# Patient Record
Sex: Male | Born: 1945 | Race: White | Hispanic: No | Marital: Married | Smoking: Former smoker
Health system: Southern US, Community
[De-identification: ages and names within clinical notes are randomized; demographics above are authoritative.]

## PROBLEM LIST (undated history)

## (undated) DIAGNOSIS — G459 Transient cerebral ischemic attack, unspecified: Secondary | ICD-10-CM

## (undated) DIAGNOSIS — S83209A Unspecified tear of unspecified meniscus, current injury, unspecified knee, initial encounter: Secondary | ICD-10-CM

## (undated) DIAGNOSIS — B159 Hepatitis A without hepatic coma: Secondary | ICD-10-CM

## (undated) DIAGNOSIS — Z8042 Family history of malignant neoplasm of prostate: Secondary | ICD-10-CM

## (undated) DIAGNOSIS — R972 Elevated prostate specific antigen [PSA]: Secondary | ICD-10-CM

## (undated) DIAGNOSIS — T7840XA Allergy, unspecified, initial encounter: Secondary | ICD-10-CM

## (undated) DIAGNOSIS — H9191 Unspecified hearing loss, right ear: Secondary | ICD-10-CM

## (undated) HISTORY — DX: Unspecified tear of unspecified meniscus, current injury, unspecified knee, initial encounter: S83.209A

## (undated) HISTORY — DX: Hepatitis a without hepatic coma: B15.9

## (undated) HISTORY — DX: Allergy, unspecified, initial encounter: T78.40XA

## (undated) HISTORY — DX: Unspecified hearing loss, right ear: H91.91

## (undated) HISTORY — PX: COLONOSCOPY: SHX174

## (undated) HISTORY — DX: Transient cerebral ischemic attack, unspecified: G45.9

## (undated) HISTORY — DX: Family history of malignant neoplasm of prostate: Z80.42

## (undated) HISTORY — DX: Elevated prostate specific antigen (PSA): R97.20

---

## 1966-07-03 DIAGNOSIS — B159 Hepatitis A without hepatic coma: Secondary | ICD-10-CM

## 1966-07-03 HISTORY — DX: Hepatitis a without hepatic coma: B15.9

## 1997-07-03 HISTORY — PX: HEMORRHOID SURGERY: SHX153

## 1998-07-03 HISTORY — PX: ACOUSTIC NEUROMA RESECTION: SHX5713

## 2012-12-31 DIAGNOSIS — G459 Transient cerebral ischemic attack, unspecified: Secondary | ICD-10-CM

## 2012-12-31 HISTORY — DX: Transient cerebral ischemic attack, unspecified: G45.9

## 2013-01-06 DIAGNOSIS — R7989 Other specified abnormal findings of blood chemistry: Secondary | ICD-10-CM | POA: Diagnosis not present

## 2013-01-06 DIAGNOSIS — Z6827 Body mass index (BMI) 27.0-27.9, adult: Secondary | ICD-10-CM | POA: Diagnosis not present

## 2013-01-06 DIAGNOSIS — D333 Benign neoplasm of cranial nerves: Secondary | ICD-10-CM | POA: Diagnosis not present

## 2013-01-06 DIAGNOSIS — G459 Transient cerebral ischemic attack, unspecified: Secondary | ICD-10-CM | POA: Diagnosis not present

## 2013-01-06 DIAGNOSIS — R4789 Other speech disturbances: Secondary | ICD-10-CM | POA: Diagnosis not present

## 2013-01-06 DIAGNOSIS — I6350 Cerebral infarction due to unspecified occlusion or stenosis of unspecified cerebral artery: Secondary | ICD-10-CM | POA: Diagnosis not present

## 2013-01-06 DIAGNOSIS — R29810 Facial weakness: Secondary | ICD-10-CM | POA: Diagnosis not present

## 2013-01-06 DIAGNOSIS — Z87891 Personal history of nicotine dependence: Secondary | ICD-10-CM | POA: Diagnosis not present

## 2013-01-06 DIAGNOSIS — Z8589 Personal history of malignant neoplasm of other organs and systems: Secondary | ICD-10-CM | POA: Diagnosis not present

## 2013-01-06 DIAGNOSIS — R7309 Other abnormal glucose: Secondary | ICD-10-CM | POA: Diagnosis not present

## 2013-01-06 DIAGNOSIS — E663 Overweight: Secondary | ICD-10-CM | POA: Diagnosis not present

## 2013-01-07 DIAGNOSIS — R4789 Other speech disturbances: Secondary | ICD-10-CM | POA: Diagnosis not present

## 2013-01-07 DIAGNOSIS — D333 Benign neoplasm of cranial nerves: Secondary | ICD-10-CM | POA: Diagnosis not present

## 2013-01-07 DIAGNOSIS — R7309 Other abnormal glucose: Secondary | ICD-10-CM | POA: Diagnosis not present

## 2013-01-07 DIAGNOSIS — I079 Rheumatic tricuspid valve disease, unspecified: Secondary | ICD-10-CM | POA: Diagnosis not present

## 2013-01-07 DIAGNOSIS — G458 Other transient cerebral ischemic attacks and related syndromes: Secondary | ICD-10-CM | POA: Diagnosis not present

## 2013-01-07 DIAGNOSIS — Z8589 Personal history of malignant neoplasm of other organs and systems: Secondary | ICD-10-CM | POA: Diagnosis not present

## 2013-01-07 DIAGNOSIS — R7989 Other specified abnormal findings of blood chemistry: Secondary | ICD-10-CM | POA: Diagnosis not present

## 2013-01-07 DIAGNOSIS — G459 Transient cerebral ischemic attack, unspecified: Secondary | ICD-10-CM | POA: Diagnosis not present

## 2013-01-07 DIAGNOSIS — I6789 Other cerebrovascular disease: Secondary | ICD-10-CM | POA: Diagnosis not present

## 2013-01-07 DIAGNOSIS — I059 Rheumatic mitral valve disease, unspecified: Secondary | ICD-10-CM | POA: Diagnosis not present

## 2013-01-07 DIAGNOSIS — I6350 Cerebral infarction due to unspecified occlusion or stenosis of unspecified cerebral artery: Secondary | ICD-10-CM | POA: Diagnosis not present

## 2013-01-09 DIAGNOSIS — Z8669 Personal history of other diseases of the nervous system and sense organs: Secondary | ICD-10-CM | POA: Diagnosis not present

## 2013-01-09 DIAGNOSIS — G459 Transient cerebral ischemic attack, unspecified: Secondary | ICD-10-CM | POA: Diagnosis not present

## 2013-02-11 DIAGNOSIS — Z8673 Personal history of transient ischemic attack (TIA), and cerebral infarction without residual deficits: Secondary | ICD-10-CM | POA: Diagnosis not present

## 2013-02-11 DIAGNOSIS — D333 Benign neoplasm of cranial nerves: Secondary | ICD-10-CM | POA: Diagnosis not present

## 2013-03-29 DIAGNOSIS — Z23 Encounter for immunization: Secondary | ICD-10-CM | POA: Diagnosis not present

## 2013-05-16 DIAGNOSIS — Z0000 Encounter for general adult medical examination without abnormal findings: Secondary | ICD-10-CM | POA: Diagnosis not present

## 2013-05-16 DIAGNOSIS — Z8042 Family history of malignant neoplasm of prostate: Secondary | ICD-10-CM | POA: Diagnosis not present

## 2013-05-16 DIAGNOSIS — G459 Transient cerebral ischemic attack, unspecified: Secondary | ICD-10-CM | POA: Diagnosis not present

## 2013-05-16 DIAGNOSIS — Z13220 Encounter for screening for lipoid disorders: Secondary | ICD-10-CM | POA: Diagnosis not present

## 2013-05-20 DIAGNOSIS — Z0000 Encounter for general adult medical examination without abnormal findings: Secondary | ICD-10-CM | POA: Diagnosis not present

## 2014-01-21 DIAGNOSIS — M1710 Unilateral primary osteoarthritis, unspecified knee: Secondary | ICD-10-CM | POA: Diagnosis not present

## 2014-01-21 DIAGNOSIS — IMO0002 Reserved for concepts with insufficient information to code with codable children: Secondary | ICD-10-CM | POA: Diagnosis not present

## 2014-01-21 DIAGNOSIS — M25569 Pain in unspecified knee: Secondary | ICD-10-CM | POA: Diagnosis not present

## 2014-03-04 DIAGNOSIS — M2240 Chondromalacia patellae, unspecified knee: Secondary | ICD-10-CM | POA: Diagnosis not present

## 2014-06-17 DIAGNOSIS — L821 Other seborrheic keratosis: Secondary | ICD-10-CM | POA: Diagnosis not present

## 2014-06-17 DIAGNOSIS — D485 Neoplasm of uncertain behavior of skin: Secondary | ICD-10-CM | POA: Diagnosis not present

## 2014-06-17 DIAGNOSIS — D239 Other benign neoplasm of skin, unspecified: Secondary | ICD-10-CM | POA: Diagnosis not present

## 2014-06-17 DIAGNOSIS — D225 Melanocytic nevi of trunk: Secondary | ICD-10-CM | POA: Diagnosis not present

## 2014-06-17 DIAGNOSIS — L570 Actinic keratosis: Secondary | ICD-10-CM | POA: Diagnosis not present

## 2014-07-22 DIAGNOSIS — M2242 Chondromalacia patellae, left knee: Secondary | ICD-10-CM | POA: Diagnosis not present

## 2014-07-29 ENCOUNTER — Encounter: Payer: Self-pay | Admitting: Family Medicine

## 2014-07-29 ENCOUNTER — Ambulatory Visit (INDEPENDENT_AMBULATORY_CARE_PROVIDER_SITE_OTHER): Payer: Medicare Other | Admitting: Family Medicine

## 2014-07-29 VITALS — BP 118/78 | HR 58 | Temp 97.9°F | Ht 70.0 in | Wt 179.0 lb

## 2014-07-29 DIAGNOSIS — Z8042 Family history of malignant neoplasm of prostate: Secondary | ICD-10-CM | POA: Diagnosis not present

## 2014-07-29 DIAGNOSIS — J300 Vasomotor rhinitis: Secondary | ICD-10-CM

## 2014-07-29 DIAGNOSIS — Z8673 Personal history of transient ischemic attack (TIA), and cerebral infarction without residual deficits: Secondary | ICD-10-CM | POA: Diagnosis not present

## 2014-07-29 MED ORDER — AZELASTINE HCL 0.15 % NA SOLN: NASAL | Status: DC

## 2014-07-29 NOTE — Progress Notes (Signed)
Office Note 07/29/2014  CC:  Chief Complaint  Patient presents with  . Establish Care   HPI:   Richard Macdonald is a 69 y.o. White male who is here to establish care. Patient's most recent primary MD: Dr. Cheryle Horsfall in [place], [place]. Old records were not reviewed prior to or during today's visit.  One yr of left knee (intermittent) pain recently evaluated by [place] ortho provider, patient can't recall what dx was given but he was given some advice to try and it is improving.  He recalls a CPE and some blood tests being done within the last year. PSA's have all been fine.  No urinary complaints. He is concerned about his father's hx of prostate ca and it not being detected until it had spread.  He complains of long hx of nighttime nasal congestion.  Otherwise asymptomatic.  Past Medical History  Diagnosis Date  . Hepatitis A 1968  . TIA (transient ischemic attack) 2014    July.  Right thumb numbness and dysarthria.  Full w/u unrevealing; at the time was on no ASA, was put on 1/4 ASA qd after and has been fine.  . Deafness in right ear     30% hearing remaining R ear.  L ear normal.    Past Surgical History  Procedure Laterality Date  . 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 Hemorrhoid surgery  1999  . Colonoscopy  2007    Normal: Repeat 2017    Family History  Problem Relation Age of Onset  . Prostate cancer Father   . Breast cancer Sister     History   Social History  . Marital Status: Married    Spouse Name: N/A    Number of Children: N/A  . Years of Education: N/A   Occupational History  . Not on file.   Social History Main Topics  . Smoking status: Former Research scientist (life sciences)  . Smokeless tobacco: Never Used  . Alcohol Use: 0.0 oz/week    0 Not specified per week  . Drug Use: No  . Sexual Activity: Not on file   Other Topics Concern  . Not on file   Social History Narrative   Married, 3 children in their 26s.   College educated.   Occupation:  retired from [employer] (42 yrs).   Former smoker: 10 pack-yr hx, quit 1973.   Alcohol: 4-5 glasses of wine per week.   Exercises 3 days/week minimum x 1.5 hours; [employer], bike, hiking.       Outpatient Encounter Prescriptions as of 07/29/2014  Medication Sig  . aspirin 81 MG tablet Take 81 mg by mouth daily. Takes 1/4 tablet daily  . Azelastine HCl (ASTEPRO) 0.15 % SOLN 2 sprays in each nostril q12h prn nasal congestion    No Known Allergies  ROS Review of Systems  Constitutional: Negative for fever and fatigue.  HENT: Negative for congestion and sore throat.   Eyes: Negative for visual disturbance.  Respiratory: Negative for cough.   Cardiovascular: Negative for chest pain.  Gastrointestinal: Negative for nausea and abdominal pain.  Genitourinary: Negative for dysuria.  Musculoskeletal: Negative for back pain and joint swelling.  Skin: Negative for rash.  Neurological: Negative for weakness and headaches.  Hematological: Negative for adenopathy.    PE; Blood pressure 118/78, pulse 58, temperature 97.9 F (36.6 C), temperature source Temporal, height 5\' 10"  (1.778 m), weight 179 lb (81.194 kg), SpO2 99 %. Gen: Alert, well appearing.  Patient  is oriented to person, place, time, and situation. SUP:JSRP: no injection, icteris, swelling, or exudate.  EOMI, PERRLA. Mouth: lips without lesion/swelling.  Oral mucosa pink and moist. Oropharynx without erythema, exudate, or swelling.  Nose: mild mucosal congestion bilat, no mucous or injection noted. CV: RRR, no m/r/g.   LUNGS: CTA bilat, nonlabored resps, good aeration in all lung fields. EXT: no clubbing, cyanosis, or edema.   Pertinent labs:  none  ASSESSMENT AND PLAN:   New pt; obtain old/pertinent records from previous PCP.  1) Hx of TIAs in 2014: doing well, no events since getting on 1/4 ASA qd. No modifiable RF's at this time.  2) Vasomotor rhinitis: trial of astepro 2 sprays qhs.  3) FH of prostate  ca (father): we discussed this today.  He wanted to make sure we were on the same page as far as screening (apparently his father had PSA testing but never had a rectal exam).  I assured him I would do appropriate PSA testing AND DRE.  An After Visit Summary was printed and given to the patient.  Return in about 3 months (around 10/28/2014) for annual CPE (fasting).

## 2014-07-29 NOTE — Progress Notes (Signed)
Pre visit review using our clinic review tool, if applicable. No additional management support is needed unless otherwise documented below in the visit note. 

## 2014-11-03 ENCOUNTER — Ambulatory Visit: Payer: Medicare Other | Admitting: Family Medicine

## 2014-11-03 ENCOUNTER — Encounter: Payer: Self-pay | Admitting: Family Medicine

## 2014-11-03 VITALS — BP 125/73 | HR 61 | Temp 97.8°F | Resp 16 | Ht 70.0 in | Wt 179.0 lb

## 2014-11-03 DIAGNOSIS — Z0000 Encounter for general adult medical examination without abnormal findings: Secondary | ICD-10-CM

## 2014-11-03 DIAGNOSIS — Z125 Encounter for screening for malignant neoplasm of prostate: Secondary | ICD-10-CM

## 2014-11-03 LAB — CBC WITH DIFFERENTIAL/PLATELET
BASOS ABS: 0 10*3/uL (ref 0.0–0.1)
Basophils Relative: 0.3 % (ref 0.0–3.0)
EOS ABS: 0.3 10*3/uL (ref 0.0–0.7)
Eosinophils Relative: 4.6 % (ref 0.0–5.0)
HCT: 43.8 % (ref 39.0–52.0)
HEMOGLOBIN: 14.9 g/dL (ref 13.0–17.0)
LYMPHS PCT: 28.2 % (ref 12.0–46.0)
Lymphs Abs: 1.7 10*3/uL (ref 0.7–4.0)
MCHC: 34.2 g/dL (ref 30.0–36.0)
MCV: 89.4 fl (ref 78.0–100.0)
MONOS PCT: 9.2 % (ref 3.0–12.0)
Monocytes Absolute: 0.5 10*3/uL (ref 0.1–1.0)
NEUTROS ABS: 3.4 10*3/uL (ref 1.4–7.7)
NEUTROS PCT: 57.7 % (ref 43.0–77.0)
Platelets: 189 10*3/uL (ref 150.0–400.0)
RBC: 4.9 Mil/uL (ref 4.22–5.81)
RDW: 14.1 % (ref 11.5–15.5)
WBC: 5.9 10*3/uL (ref 4.0–10.5)

## 2014-11-03 LAB — COMPREHENSIVE METABOLIC PANEL
ALBUMIN: 4.4 g/dL (ref 3.5–5.2)
ALK PHOS: 57 U/L (ref 39–117)
ALT: 21 U/L (ref 0–53)
AST: 18 U/L (ref 0–37)
BILIRUBIN TOTAL: 0.9 mg/dL (ref 0.2–1.2)
BUN: 17 mg/dL (ref 6–23)
CO2: 31 mEq/L (ref 19–32)
Calcium: 9.5 mg/dL (ref 8.4–10.5)
Chloride: 103 mEq/L (ref 96–112)
Creatinine, Ser: 1.06 mg/dL (ref 0.40–1.50)
GFR: 73.59 mL/min (ref >60.00)
GLUCOSE: 100 mg/dL — AB (ref 70–99)
Potassium: 4.6 mEq/L (ref 3.5–5.1)
Sodium: 138 mEq/L (ref 135–145)
Total Protein: 7.5 g/dL (ref 6.0–8.3)

## 2014-11-03 LAB — PSA: PSA: 3.78 ng/mL (ref 0.10–4.00)

## 2014-11-03 LAB — LIPID PANEL
CHOL/HDL RATIO: 4
Cholesterol: 185 mg/dL (ref 0–200)
HDL: 44.3 mg/dL (ref >39.00)
LDL Cholesterol: 120 mg/dL — ABNORMAL HIGH (ref 0–99)
NonHDL: 140.7
Triglycerides: 103 mg/dL (ref 0.0–149.0)
VLDL: 20.6 mg/dL (ref 0.0–40.0)

## 2014-11-03 NOTE — Progress Notes (Signed)
Pre visit review using our clinic review tool, if applicable. No additional management support is needed unless otherwise documented below in the visit note. 

## 2014-11-03 NOTE — Progress Notes (Signed)
Office Note 11/03/2014  CC:  Chief Complaint  Patient presents with  . Annual Exam    HPI:  Richard Macdonald is a 69 y.o. White male who is here for fasting CPE today. Needs DRE and PSA: prost ca screening.  Reviewed old records today: it appears he had a TIA, sx's lasted a bit over an hour, he was put on low dose ASA and there was a complete w/u that was unrevealing.  There was consideration of starting a statin but in the end I think they decided he did not fit into the category of "secondary prevention".  We re-reviewed this concept today and he is in agreement with this approach.  If he has recurrent TIAs or a CVA then we will have to have the discussion of starting statin for secondary prevention purposes again.  Eye exam: last one was 2 yrs ago.  No vision complaints. Dental exams UTD. Has local dermatologist for screening exams annually. 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 He exercises 3-4 days a week: 30 min cardio + stretching and resistance exercises.   Past Medical History  Diagnosis Date  . Hepatitis A 1968  . TIA (transient ischemic attack) 12/2012    July.  Right thumb numbness and dysarthria.  Full w/u (EKG, echo, carotids, MRI brain) unrevealing; at the time was on no ASA, was put on 1/4 ASA qd after and has been fine.  . Deafness in right ear     30% hearing remaining R ear.  L ear normal.  . PUD (peptic ulcer disease) 2014    Past Surgical History  Procedure Laterality Date  . 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 Hemorrhoid surgery  1999  . Colonoscopy  2007    Normal: Repeat 2017    Family History  Problem Relation Age of Onset  . Prostate cancer Father   . Breast cancer Sister     History   Social History  . Marital Status: Married    Spouse Name: N/A  . Number of Children: N/A  . Years of Education: N/A   Occupational History  .  Not on file.   Social History Main Topics  . Smoking status: Former Research scientist (life sciences)  . Smokeless tobacco: Never Used  . Alcohol Use: 0.6 oz/week    0 Standard drinks or equivalent, 1 Glasses of wine per week     Comment: 3-4 days a week  . Drug Use: No  . Sexual Activity: Not on file   Other Topics Concern  . Not on file   Social History Narrative   Married, 3 children in their 40s.   College educated.   Occupation: retired from [employer] (42 yrs).   Former smoker: 10 pack-yr hx, quit 1973.   Alcohol: 4-5 glasses of wine per week.   Exercises 3 days/week minimum x 1.5 hours; [employer], bike, hiking.       Outpatient Prescriptions Prior to Visit  Medication Sig Dispense Refill  . aspirin 81 MG tablet Take 81 mg by mouth daily. Takes 1/4 tablet daily    . Azelastine HCl (ASTEPRO) 0.15 % SOLN 2 sprays in each nostril q12h prn nasal congestion (Patient not taking: Reported on 11/03/2014) 30 mL 6   No facility-administered medications prior to visit.    No Known Allergies  ROS Review of Systems  Constitutional: Negative for fever, chills, appetite  change and fatigue.  HENT: Negative for congestion, dental problem, ear pain and sore throat.   Eyes: Negative for discharge, redness and visual disturbance.  Respiratory: Negative for cough, chest tightness, shortness of breath and wheezing.   Cardiovascular: Negative for chest pain, palpitations and leg swelling.  Gastrointestinal: Negative for nausea, vomiting, abdominal pain, diarrhea and blood in stool.  Genitourinary: Negative for dysuria, urgency, frequency, hematuria, flank pain and difficulty urinating.  Musculoskeletal: Negative for myalgias, back pain, joint swelling, arthralgias and neck stiffness.  Skin: Negative for pallor and rash.  Neurological: Negative for dizziness, speech difficulty, weakness and headaches.  Hematological: Negative for adenopathy. Does not bruise/bleed easily.  Psychiatric/Behavioral:  Negative for confusion and sleep disturbance. The patient is not nervous/anxious.     PE; Blood pressure 125/73, pulse 61, temperature 97.8 F (36.6 C), temperature source Temporal, resp. rate 16, height 5\' 10"  (1.778 m), weight 179 lb (81.194 kg), SpO2 95 %. Gen: Alert, well appearing.  Patient is oriented to person, place, time, and situation. AFFECT: pleasant, lucid thought and speech. ENT: Ears: EACs clear, normal epithelium.  TMs with good light reflex and landmarks bilaterally.  Eyes: no injection, icteris, swelling, or exudate.  EOMI, PERRLA. Nose: no drainage or turbinate edema/swelling.  No injection or focal lesion.  Mouth: lips without lesion/swelling.  Oral mucosa pink and moist.  Dentition intact and without obvious caries or gingival swelling.  Oropharynx without erythema, exudate, or swelling.  Neck: supple/nontender.  No LAD, mass, or TM.  Carotid pulses 2+ bilaterally, without bruits. CV: RRR, no m/r/g.   LUNGS: CTA bilat, nonlabored resps, good aeration in all lung fields. ABD: soft, NT, ND, BS normal.  No hepatospenomegaly or mass.  No bruits. EXT: no clubbing, cyanosis, or edema.  Musculoskeletal: no joint swelling, erythema, warmth, or tenderness.  ROM of all joints intact. Skin - no sores or suspicious lesions or rashes or color changes Rectal exam: negative without mass, lesions or tenderness, PROSTATE EXAM: smooth and symmetric without nodules or tenderness.   Pertinent labs:  none  ASSESSMENT AND PLAN:   Health maintenance exam:  Reviewed age and gender appropriate health maintenance issues (prudent diet, regular exercise, health risks of tobacco and excessive alcohol, use of seatbelts, fire alarms in home, use of sunscreen).  Also reviewed age and gender appropriate health screening as well as vaccine recommendations. CBC w/diff, CMET, and FLP today. DRE normal, PSA testing done today. He is UTD on vaccines and colon ca screening. Gave pt contact info for [facility]  audiology offices in [place] per his request today.  An After Visit Summary was printed and given to the patient.  FOLLOW UP:  Return in about 1 year (around 11/03/2015) for CPE, fasting.

## 2014-11-03 NOTE — Patient Instructions (Signed)
[facility] hearing: [phone] [place].

## 2014-11-04 DIAGNOSIS — L570 Actinic keratosis: Secondary | ICD-10-CM | POA: Diagnosis not present

## 2014-11-04 DIAGNOSIS — L309 Dermatitis, unspecified: Secondary | ICD-10-CM | POA: Diagnosis not present

## 2014-11-04 DIAGNOSIS — Z86018 Personal history of other benign neoplasm: Secondary | ICD-10-CM | POA: Diagnosis not present

## 2014-11-04 DIAGNOSIS — L821 Other seborrheic keratosis: Secondary | ICD-10-CM | POA: Diagnosis not present

## 2014-11-05 ENCOUNTER — Other Ambulatory Visit: Payer: Self-pay | Admitting: Family Medicine

## 2014-11-05 ENCOUNTER — Other Ambulatory Visit (INDEPENDENT_AMBULATORY_CARE_PROVIDER_SITE_OTHER): Payer: Medicare Other

## 2014-11-05 DIAGNOSIS — R7301 Impaired fasting glucose: Secondary | ICD-10-CM | POA: Diagnosis not present

## 2014-11-05 LAB — HEMOGLOBIN A1C: HEMOGLOBIN A1C: 5.5 % (ref 4.6–6.5)

## 2015-03-01 ENCOUNTER — Telehealth: Payer: Self-pay | Admitting: Family Medicine

## 2015-03-01 DIAGNOSIS — H9193 Unspecified hearing loss, bilateral: Secondary | ICD-10-CM

## 2015-03-01 NOTE — Telephone Encounter (Signed)
Patient's wife is requesting a referral to [facility] audiology dept for a hearing test.

## 2015-03-01 NOTE — Telephone Encounter (Signed)
Please advise. Thanks.  

## 2015-03-02 NOTE — Telephone Encounter (Signed)
OK, referral ordered as per pt request. 

## 2015-03-02 NOTE — Telephone Encounter (Signed)
Pt advised and voiced understanding.   

## 2015-04-09 ENCOUNTER — Ambulatory Visit: Payer: Medicare Other | Attending: Audiology | Admitting: Audiology

## 2015-04-09 DIAGNOSIS — H9193 Unspecified hearing loss, bilateral: Secondary | ICD-10-CM | POA: Insufficient documentation

## 2015-04-09 DIAGNOSIS — H93291 Other abnormal auditory perceptions, right ear: Secondary | ICD-10-CM | POA: Diagnosis not present

## 2015-04-09 DIAGNOSIS — D333 Benign neoplasm of cranial nerves: Secondary | ICD-10-CM | POA: Diagnosis not present

## 2015-04-16 NOTE — Patient Instructions (Signed)
RECOMMENDATIONS:    1. Follow regarding the findings on the 2014 MRI is recommended. 2. Hearing should be monitored at least on an annual basis and sooner should there be an increase in symptoms.  3. A hearing aid evaluation or assistive listening evaluation is recommended and a referral has been made to Dr. Lonzo Candy, Audiologist.  Amplification will ensure that his current listening skills (not the auditory nerve) do not deteriorate further from auditory deprivation.  In addition, there are also ways to improve one's listening environment. Such as:  (A) When in a restaurant, sit with your back against a perimeter wall, thus reducing the extraneous noise.   (B) When in meetings, do not sit near an open doorway and position yourself within 8-10ft of the speaker. Front and center is typically best.  (C) Eliminate background noises at home such as the television, dishwasher, etc when having important conversations.  (D) Ask family members to face you when they speak and not to drop their heads or turn and walkway while still speaking.   4. Because Richard Macdonald has a weakened auditory system he is more at risk from damage of noise exposure than someone with a healthy auditory system and therefore should avoid excessive nose exposure if possible or certainly utilize ear protection.     Richard Macdonald, Au.D. Doctor of Audiology CCC-A 04/16/2015

## 2015-04-16 NOTE — Procedures (Signed)
[facility] [address] [place], [place]  [address] [place] EVALUATION  Patient Name: [place] Record Number:  [id number] Date of Birth:  Mar 14, 1946        Date of Test:  04/16/2015  HISTORY:  Kyrian, a 69 y.o. old male was seen for audiological evaluation upon referral of MCGOWEN,PHILIP H, MD.   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  Follow up evaluations were perormed at 6 months, 18 months and at 4 years, with a recommendation of every 4 years after that.  A review of a most recent MRI of 2014 indicated the need for an IAC study to further investigate findings suggesting the presence of an acoustic neuroma. He declined and signed a waiver for the 4 year follow ups. He stated that is was left with 30% residual hearing on the right side and constant tinnitus which he reports does not bother him. He has however, gradually noticed more difficulty hearing for the past 6 months, particularly from a distance or in background noise. He does not report facial numbness or balance concerns.    REPORT OF PAIN:  None  EVALUATION:   Air and bone conduction audiometry from 500Hz  - 8000Hz  utilizing insert earphones revealed asymmetrical hearing with a slight to severe sensory neural loss on the left side and a mild to profound sensory neural loss on the right side.   Speech reception thresholds were consistent with the pure tone results indicative of good test reliability.  Speech recognition testing was conducted in each ear independently, at a comfortable listening level (60-95dBHL) and indicated 50% and 96% in the right and left ears respectively.  Speech recognition abilities while in the presence of a soft background noise (s/n+5) were also evaluated.  Under this condition, scores were 0% in the right and 60% in the left ear.  Immittance Audiometry  was utilized and Type A Tympanograms were obtained on the the right and left sides indicating normal ear canal volume, tympanic membrane compliance and pressure.  Acoustic reflexes were tested from 500Hz  - 2000Hz  and were present  on the right side and present on the left side. Acoustic reflex decay was administered on the right side at 500Hz  and 1000Hz  with no decay noted,  However the presentation level was not 10dB over threshold which would have been more likely to elicit decay.  Distortion Product Otoacoustic Emissions were tested from 2000Hz  - 10000Hz  and were absent on the right side and absent on the left side, indicative of poor outer hair cell function within in the inner ear.  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  Communication is expected to be impaired at soft conversational levels or at a distance, and significantly impaired when a background noise is present.  RECOMMENDATIONS:    1. Follow regarding the findings on the 2014 MRI is recommended. 2. Hearing should be monitored at least on an annual basis and sooner should there be an increase in symptoms.  3. A hearing aid evaluation or assistive listening evaluation is recommended and a referral has been made to Dr. Lonzo Candy, Audiologist.  Amplification will ensure that his current listening skills (not the auditory nerve) do not deteriorate further from auditory deprivation.  In addition, there are also ways to improve one's listening environment. Such as:  (A) When in a restaurant, sit with your back against a perimeter wall, thus reducing the extraneous noise.   (B) When in meetings, do not sit near an open doorway and position yourself within 8-10ft of the speaker. Front and center is typically best.  (C) Eliminate background noises at home such as the television, dishwasher,  etc when having important conversations.  (D) Ask family members to face you when they speak and not to drop their heads or turn and walkway while still speaking.   4. Because Ericson has a weakened auditory system he is more at risk from damage of noise exposure than someone with a healthy auditory system and therefore should avoid excessive nose exposure if possible or certainly utilize ear protection.     Ivonne Andrew Pugh, Au.D. Doctor of Audiology CCC-A 04/16/2015

## 2015-09-02 DIAGNOSIS — H903 Sensorineural hearing loss, bilateral: Secondary | ICD-10-CM | POA: Diagnosis not present

## 2015-11-17 ENCOUNTER — Ambulatory Visit (INDEPENDENT_AMBULATORY_CARE_PROVIDER_SITE_OTHER): Payer: Medicare Other | Admitting: Family Medicine

## 2015-11-17 ENCOUNTER — Encounter: Payer: Self-pay | Admitting: Family Medicine

## 2015-11-17 VITALS — BP 108/73 | HR 56 | Temp 98.3°F | Resp 16 | Ht 70.0 in | Wt 181.0 lb

## 2015-11-17 DIAGNOSIS — Z8673 Personal history of transient ischemic attack (TIA), and cerebral infarction without residual deficits: Secondary | ICD-10-CM | POA: Diagnosis not present

## 2015-11-17 DIAGNOSIS — Z0000 Encounter for general adult medical examination without abnormal findings: Secondary | ICD-10-CM | POA: Diagnosis not present

## 2015-11-17 DIAGNOSIS — Z125 Encounter for screening for malignant neoplasm of prostate: Secondary | ICD-10-CM

## 2015-11-17 DIAGNOSIS — Z1159 Encounter for screening for other viral diseases: Secondary | ICD-10-CM | POA: Diagnosis not present

## 2015-11-17 DIAGNOSIS — Z1211 Encounter for screening for malignant neoplasm of colon: Secondary | ICD-10-CM

## 2015-11-17 LAB — COMPREHENSIVE METABOLIC PANEL
ALT: 15 U/L (ref 9–46)
AST: 16 U/L (ref 10–35)
Albumin: 4.4 g/dL (ref 3.6–5.1)
Alkaline Phosphatase: 57 U/L (ref 40–115)
BUN: 15 mg/dL (ref 7–25)
CHLORIDE: 102 mmol/L (ref 98–110)
CO2: 26 mmol/L (ref 20–31)
Calcium: 8.9 mg/dL (ref 8.6–10.3)
Creat: 1.14 mg/dL (ref 0.70–1.18)
Glucose, Bld: 97 mg/dL (ref 65–99)
POTASSIUM: 4.2 mmol/L (ref 3.5–5.3)
Sodium: 138 mmol/L (ref 135–146)
TOTAL PROTEIN: 7.1 g/dL (ref 6.1–8.1)
Total Bilirubin: 0.9 mg/dL (ref 0.2–1.2)

## 2015-11-17 LAB — CBC WITH DIFFERENTIAL/PLATELET
BASOS PCT: 0 %
Basophils Absolute: 0 cells/uL (ref 0–200)
Eosinophils Absolute: 150 cells/uL (ref 15–500)
Eosinophils Relative: 3 %
HEMATOCRIT: 45.5 % (ref 38.5–50.0)
HEMOGLOBIN: 14.9 g/dL (ref 13.2–17.1)
LYMPHS ABS: 1700 {cells}/uL (ref 850–3900)
Lymphocytes Relative: 34 %
MCH: 30.2 pg (ref 27.0–33.0)
MCHC: 32.7 g/dL (ref 32.0–36.0)
MCV: 92.3 fL (ref 80.0–100.0)
MONO ABS: 350 {cells}/uL (ref 200–950)
MPV: 10.2 fL (ref 7.5–12.5)
Monocytes Relative: 7 %
NEUTROS ABS: 2800 {cells}/uL (ref 1500–7800)
Neutrophils Relative %: 56 %
Platelets: 182 10*3/uL (ref 140–400)
RBC: 4.93 MIL/uL (ref 4.20–5.80)
RDW: 13.7 % (ref 11.0–15.0)
WBC: 5 10*3/uL (ref 3.8–10.8)

## 2015-11-17 LAB — LIPID PANEL
Cholesterol: 170 mg/dL (ref 125–200)
HDL: 48 mg/dL (ref >=40)
LDL CALC: 110 mg/dL (ref <130)
TRIGLYCERIDES: 62 mg/dL (ref <150)
Total CHOL/HDL Ratio: 3.5 Ratio (ref <=5.0)
VLDL: 12 mg/dL (ref <30)

## 2015-11-17 NOTE — Progress Notes (Signed)
Office Note 11/17/2015  CC:  Chief Complaint  Patient presents with  . Annual Exam    Pt is fasting.     HPI:  Richard Macdonald is a 70 y.o. White male who is here for annual health maintenance exam. Feeling well. Exercises regularly, has a healthy diet. Eye exam UTD. He got hearing aids since his last CPE.     Past Medical History  Diagnosis Date  . Hepatitis A 1968  . TIA (transient ischemic attack) 12/2012    July.  Right thumb numbness and dysarthria.  Full w/u (EKG, echo, carotids, MRI brain) unrevealing; at the time was on no ASA, was put on 1/4 ASA qd after and has been fine.  . Deafness in right ear     30% hearing remaining R ear.  L ear normal.  . PUD (peptic ulcer disease) 2014    Past Surgical History  Procedure Laterality Date  . 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 Hemorrhoid surgery  1999  . Colonoscopy  2007    Normal: Repeat 2017    Family History  Problem Relation Age of Onset  . Prostate cancer Father   . Breast cancer Sister     Social History   Social History  . Marital Status: Married    Spouse Name: N/A  . Number of Children: N/A  . Years of Education: N/A   Occupational History  . Not on file.   Social History Main Topics  . Smoking status: Former Research scientist (life sciences)  . Smokeless tobacco: Never Used  . Alcohol Use: 0.6 oz/week    0 Standard drinks or equivalent, 1 Glasses of wine per week     Comment: 3-4 days a week  . Drug Use: No  . Sexual Activity: Not on file   Other Topics Concern  . Not on file   Social History Narrative   Married, 3 children in their 80s.   College educated.   Occupation: retired from [employer] (42 yrs).   Former smoker: 10 pack-yr hx, quit 1973.   Alcohol: 4-5 glasses of wine per week.   Exercises 3 days/week minimum x 1.5 hours; [employer], bike, hiking.       Outpatient Prescriptions Prior to Visit  Medication Sig Dispense Refill  . aspirin 81 MG tablet Take 81 mg by  mouth daily. Takes 1/4 tablet daily    . Azelastine HCl (ASTEPRO) 0.15 % SOLN 2 sprays in each nostril q12h prn nasal congestion (Patient not taking: Reported on 11/03/2014) 30 mL 6   No facility-administered medications prior to visit.    No Known Allergies  ROS Review of Systems  Constitutional: Negative for fever, chills, appetite change and fatigue.  HENT: Negative for congestion, dental problem, ear pain and sore throat.   Eyes: Negative for discharge, redness and visual disturbance.  Respiratory: Negative for cough, chest tightness, shortness of breath and wheezing.   Cardiovascular: Negative for chest pain, palpitations and leg swelling.  Gastrointestinal: Negative for nausea, vomiting, abdominal pain, diarrhea and blood in stool.  Genitourinary: Negative for dysuria, urgency, frequency, hematuria, flank pain and difficulty urinating.  Musculoskeletal: Negative for myalgias, back pain, joint swelling, arthralgias and neck stiffness.  Skin: Negative for pallor and rash.  Neurological: Negative for dizziness, speech difficulty, weakness and headaches.  Hematological: Negative for adenopathy. Does not bruise/bleed easily.  Psychiatric/Behavioral: Negative for confusion and sleep disturbance. The patient is not nervous/anxious.     PE; Blood pressure 108/73, pulse  56, temperature 98.3 F (36.8 C), temperature source Oral, resp. rate 16, height 5\' 10"  (1.778 m), weight 181 lb (82.101 kg), SpO2 97 %. Gen: Alert, well appearing.  Patient is oriented to person, place, time, and situation. AFFECT: pleasant, lucid thought and speech. ENT: Ears: EACs clear, normal epithelium.  TMs with good light reflex and landmarks bilaterally.  Eyes: no injection, icteris, swelling, or exudate.  EOMI, PERRLA. Nose: no drainage or turbinate edema/swelling.  No injection or focal lesion.  Mouth: lips without lesion/swelling.  Oral mucosa pink and moist.  Dentition intact and without obvious caries or  gingival swelling.  Oropharynx without erythema, exudate, or swelling.  Neck: supple/nontender.  No LAD, mass, or TM.  Carotid pulses 2+ bilaterally, without bruits. CV: RRR, no m/r/g.   LUNGS: CTA bilat, nonlabored resps, good aeration in all lung fields. ABD: soft, NT, ND, BS normal.  No hepatospenomegaly or mass.  No bruits. EXT: no clubbing, cyanosis, or edema.  Musculoskeletal: no joint swelling, erythema, warmth, or tenderness.  ROM of all joints intact. Skin - no sores or suspicious lesions or rashes or color changes Rectal exam: negative without mass, lesions or tenderness, PROSTATE EXAM: smooth and symmetric without nodules or tenderness, enlarged 2+.  Pertinent labs:  No results found for: TSH Lab Results  Component Value Date   WBC 5.9 11/03/2014   HGB 14.9 11/03/2014   HCT 43.8 11/03/2014   MCV 89.4 11/03/2014   PLT 189.0 11/03/2014   Lab Results  Component Value Date   CREATININE 1.06 11/03/2014   BUN 17 11/03/2014   NA 138 11/03/2014   K 4.6 11/03/2014   CL 103 11/03/2014   CO2 31 11/03/2014   Lab Results  Component Value Date   ALT 21 11/03/2014   AST 18 11/03/2014   ALKPHOS 57 11/03/2014   BILITOT 0.9 11/03/2014   Lab Results  Component Value Date   CHOL 185 11/03/2014   Lab Results  Component Value Date   HDL 44.30 11/03/2014   Lab Results  Component Value Date   LDLCALC 120* 11/03/2014   Lab Results  Component Value Date   TRIG 103.0 11/03/2014   Lab Results  Component Value Date   CHOLHDL 4 11/03/2014   Lab Results  Component Value Date   PSA 3.78 11/03/2014     ASSESSMENT AND PLAN:   Health maintenance exam: Reviewed age and gender appropriate health maintenance issues (prudent diet, regular exercise, health risks of tobacco and excessive alcohol, use of seatbelts, fire alarms in home, use of sunscreen).  Also reviewed age and gender appropriate health screening as well as vaccine recommendations. Vaccines UTD. Will do fasting  CBC, CMET, lipid panel, and Hep C screening. Prostate cancer screening: DRE normal, PSA drawn.  We discussed the potential benefits and drawbacks of ongoing prostate cancer screening past age 67 and agreed to continue to screen at least until age 48 and take a conservative/cautious approach to any abnormal results. Colon ca screening: he is due--referred to [facility] for repeat colonoscopy.  An After Visit Summary was printed and given to the patient.  FOLLOW UP:  Return in about 1 year (around 11/16/2016) for annual CPE (fasting).  Signed:  Crissie Sickles, MD           11/17/2015

## 2015-11-17 NOTE — Progress Notes (Signed)
Pre visit review using our clinic review tool, if applicable. No additional management support is needed unless otherwise documented below in the visit note. 

## 2015-11-18 LAB — PSA, MEDICARE: PSA: 2.92 ng/mL (ref <=4.00)

## 2015-11-18 LAB — HEPATITIS C ANTIBODY: HCV Ab: NEGATIVE

## 2015-11-26 ENCOUNTER — Encounter: Payer: Self-pay | Admitting: Family Medicine

## 2015-11-26 DIAGNOSIS — Z13220 Encounter for screening for lipoid disorders: Secondary | ICD-10-CM | POA: Insufficient documentation

## 2015-12-20 ENCOUNTER — Encounter: Payer: Self-pay | Admitting: Family Medicine

## 2015-12-20 DIAGNOSIS — H43811 Vitreous degeneration, right eye: Secondary | ICD-10-CM | POA: Diagnosis not present

## 2015-12-20 DIAGNOSIS — H40013 Open angle with borderline findings, low risk, bilateral: Secondary | ICD-10-CM | POA: Diagnosis not present

## 2015-12-21 ENCOUNTER — Encounter: Payer: Self-pay | Admitting: Gastroenterology

## 2016-01-20 ENCOUNTER — Ambulatory Visit (AMBULATORY_SURGERY_CENTER): Payer: Self-pay | Admitting: *Deleted

## 2016-01-20 VITALS — Ht 70.0 in | Wt 178.0 lb

## 2016-01-20 DIAGNOSIS — Z1211 Encounter for screening for malignant neoplasm of colon: Secondary | ICD-10-CM

## 2016-01-20 MED ORDER — NA SULFATE-K SULFATE-MG SULF 17.5-3.13-1.6 GM/177ML PO SOLN: 1.0000 | Freq: Once | ORAL | Status: DC

## 2016-01-20 NOTE — Progress Notes (Signed)
No egg or soy allergy known to patient  No issues with past sedation with any surgeries  or procedures, no intubation problems  No diet pills per patient No home 02 use per patient  No blood thinners per patient  Pt denies issues with constipation  emmi declined

## 2016-02-03 ENCOUNTER — Encounter: Payer: Self-pay | Admitting: Gastroenterology

## 2016-02-03 ENCOUNTER — Ambulatory Visit (AMBULATORY_SURGERY_CENTER): Payer: Medicare Other | Admitting: Gastroenterology

## 2016-02-03 VITALS — BP 122/78 | HR 71 | Temp 98.4°F | Resp 16 | Ht 70.0 in | Wt 178.0 lb

## 2016-02-03 DIAGNOSIS — Z1211 Encounter for screening for malignant neoplasm of colon: Secondary | ICD-10-CM | POA: Diagnosis not present

## 2016-02-03 MED ORDER — SODIUM CHLORIDE 0.9 % IV SOLN: 500.0000 mL | INTRAVENOUS | Status: DC

## 2016-02-03 NOTE — Op Note (Signed)
[place] Patient Name: Richard Macdonald Procedure Date: 02/03/2016 9:18 AM MRN: [id number] Endoscopist: Mallie Mussel L. Loletha Carrow , MD Age: 70 Referring MD:  Date of Birth: 09/06/1945 Gender: Male Account #: [id number] Procedure:                Colonoscopy Indications:              Screening for colorectal malignant neoplasm, This                            is the patient's first colonoscopy Medicines:                Monitored Anesthesia Care Procedure:                Pre-Anesthesia Assessment:                           - Prior to the procedure, a History and Physical                            was performed, and patient medications and                            allergies were reviewed. The patient's tolerance of                            previous anesthesia was also reviewed. The risks                            and benefits of the procedure and the sedation                            options and risks were discussed with the patient.                            All questions were answered, and informed consent                            was obtained. Prior Anticoagulants: The patient has                            taken aspirin, last dose was 1 day prior to                            procedure. ASA Grade Assessment: II - A patient                            with mild systemic disease. After reviewing the                            risks and benefits, the patient was deemed in                            satisfactory condition to undergo the procedure.  After obtaining informed consent, the colonoscope                            was passed under direct vision. Throughout the                            procedure, the patient's blood pressure, pulse, and                            oxygen saturations were monitored continuously. The                            Model CF-HQ190L [id number]) scope was introduced                            through the anus and advanced  to the the cecum,                            identified by appendiceal orifice and ileocecal                            valve. The colonoscopy was performed without                            difficulty. The patient tolerated the procedure                            well. The quality of the bowel preparation was                            excellent. The ileocecal valve, appendiceal                            orifice, and rectum were photographed. Scope In: 9:27:58 AM Scope Out: 9:40:30 AM Scope Withdrawal Time: 0 hours 9 minutes 38 seconds  Total Procedure Duration: 0 hours 12 minutes 32 seconds  Findings:                 The perianal and digital rectal examinations were                            normal.                           Diverticula were found in the left colon.                           The exam was otherwise without abnormality on                            direct and retroflexion views.                           Internal hemorrhoids were found during  retroflexion. The hemorrhoids were Grade I                            (internal hemorrhoids that do not prolapse). Complications:            No immediate complications. Estimated Blood Loss:     Estimated blood loss: none. Impression:               - Diverticulosis in the left colon.                           - The examination was otherwise normal on direct                            and retroflexion views.                           - Internal hemorrhoids.                           - No specimens collected. Recommendation:           - Patient has a contact number available for                            emergencies. The signs and symptoms of potential                            delayed complications were discussed with the                            patient. Return to normal activities tomorrow.                            Written discharge instructions were provided to the                             patient.                           - Resume previous diet.                           - Continue present medications.                           - No repeat colonoscopy due to age. Yassine Brunsman L. Loletha Carrow, MD 02/03/2016 9:49:14 AM This report has been signed electronically.

## 2016-02-03 NOTE — Progress Notes (Signed)
Report to PACU, RN, vss, BBS= Clear.  

## 2016-02-03 NOTE — Patient Instructions (Signed)
YOU HAD AN ENDOSCOPIC PROCEDURE TODAY AT [facility]:   Refer to the procedure report that was given to you for any specific questions about what was found during the examination.  If the procedure report does not answer your questions, please call your gastroenterologist to clarify.  If you requested that your care partner not be given the details of your procedure findings, then the procedure report has been included in a sealed envelope for you to review at your convenience later.  YOU SHOULD EXPECT: Some feelings of bloating in the abdomen. Passage of more gas than usual.  Walking can help get rid of the air that was put into your GI tract during the procedure and reduce the bloating. If you had a lower endoscopy (such as a colonoscopy or flexible sigmoidoscopy) you may notice spotting of blood in your stool or on the toilet paper. If you underwent a bowel prep for your procedure, you may not have a normal bowel movement for a few days.  Please Note:  You might notice some irritation and congestion in your nose or some drainage.  This is from the oxygen used during your procedure.  There is no need for concern and it should clear up in a day or so.  SYMPTOMS TO REPORT IMMEDIATELY:   Following lower endoscopy (colonoscopy or flexible sigmoidoscopy):  Excessive amounts of blood in the stool  Significant tenderness or worsening of abdominal pains  Swelling of the abdomen that is new, acute  Fever of 100F or higher  For urgent or emergent issues, a gastroenterologist can be reached at any hour by calling [phone].   DIET: Your first meal following the procedure should be a small meal and then it is ok to progress to your normal diet. Heavy or fried foods are harder to digest and may make you feel nauseous or bloated.  Likewise, meals heavy in dairy and vegetables can increase bloating.  Drink plenty of fluids but you should avoid alcoholic beverages for 24  hours.  ACTIVITY:  You should plan to take it easy for the rest of today and you should NOT DRIVE or use heavy machinery until tomorrow (because of the sedation medicines used during the test).    FOLLOW UP: Our staff will call the number listed on your records the next business day following your procedure to check on you and address any questions or concerns that you may have regarding the information given to you following your procedure. If we do not reach you, we will leave a message.  However, if you are feeling well and you are not experiencing any problems, there is no need to return our call.  We will assume that you have returned to your regular daily activities without incident.  If any biopsies were taken you will be contacted by phone or by letter within the next 1-3 weeks.  Please call us at [phone] if you have not heard about the biopsies in 3 weeks.    SIGNATURES/CONFIDENTIALITY: You and/or your care partner have signed paperwork which will be entered into your electronic medical record.  These signatures attest to the fact that that the information above on your After Visit Summary has been reviewed and is understood.  Full responsibility of the confidentiality of this discharge information lies with you and/or your care-partner.  Please read diverticulosis and hemorrhoid handouts provided.

## 2016-02-04 ENCOUNTER — Telehealth: Payer: Self-pay | Admitting: *Deleted

## 2016-02-04 NOTE — Telephone Encounter (Signed)
Left message on f/u call 

## 2016-02-14 ENCOUNTER — Encounter: Payer: Self-pay | Admitting: Family Medicine

## 2016-04-10 DIAGNOSIS — Z23 Encounter for immunization: Secondary | ICD-10-CM | POA: Diagnosis not present

## 2016-04-13 ENCOUNTER — Telehealth: Payer: Self-pay

## 2016-04-13 NOTE — Telephone Encounter (Signed)
Patient requesting medication to take for traveling to higher altitudes.

## 2016-04-17 MED ORDER — ACETAZOLAMIDE 125 MG PO TABS: ORAL_TABLET | ORAL | 0 refills | Status: DC

## 2016-04-17 NOTE — Telephone Encounter (Signed)
Med for prevention of altitude sickness was eRx'd today, per pt's request.

## 2016-04-17 NOTE — Telephone Encounter (Signed)
Detailed message left for patient.

## 2016-04-24 ENCOUNTER — Telehealth: Payer: Self-pay | Admitting: Family Medicine

## 2016-04-24 NOTE — Telephone Encounter (Signed)
Called patient and left message that this prescription was already sent to pharmacy. Patient to return call if there is any problems.

## 2016-04-24 NOTE — Telephone Encounter (Signed)
Patient email:    I will be traveling in [place] for 11 days in a couple of weeks - 8 days at attitude - as much as 14,000 feet - my wife got some pills for attitude there last week - could I have the same prescription. Thank you! [phone]

## 2017-01-22 ENCOUNTER — Other Ambulatory Visit: Payer: Self-pay

## 2017-01-23 ENCOUNTER — Ambulatory Visit (INDEPENDENT_AMBULATORY_CARE_PROVIDER_SITE_OTHER): Payer: Medicare Other | Admitting: Family Medicine

## 2017-01-23 ENCOUNTER — Encounter: Payer: Self-pay | Admitting: Family Medicine

## 2017-01-23 VITALS — BP 99/62 | HR 58 | Temp 98.1°F | Resp 16 | Ht 70.0 in | Wt 174.2 lb

## 2017-01-23 DIAGNOSIS — Z125 Encounter for screening for malignant neoplasm of prostate: Secondary | ICD-10-CM

## 2017-01-23 DIAGNOSIS — Z8673 Personal history of transient ischemic attack (TIA), and cerebral infarction without residual deficits: Secondary | ICD-10-CM | POA: Diagnosis not present

## 2017-01-23 LAB — LIPID PANEL
Cholesterol: 156 mg/dL (ref 0–200)
HDL: 45.9 mg/dL (ref >39.00)
LDL CALC: 97 mg/dL (ref 0–99)
NONHDL: 110.28
Total CHOL/HDL Ratio: 3
Triglycerides: 67 mg/dL (ref 0.0–149.0)
VLDL: 13.4 mg/dL (ref 0.0–40.0)

## 2017-01-23 LAB — COMPREHENSIVE METABOLIC PANEL
ALT: 11 U/L (ref 0–53)
AST: 13 U/L (ref 0–37)
Albumin: 4.2 g/dL (ref 3.5–5.2)
Alkaline Phosphatase: 51 U/L (ref 39–117)
BUN: 16 mg/dL (ref 6–23)
CHLORIDE: 106 meq/L (ref 96–112)
CO2: 30 meq/L (ref 19–32)
CREATININE: 1.04 mg/dL (ref 0.40–1.50)
Calcium: 9.2 mg/dL (ref 8.4–10.5)
GFR: 74.74 mL/min (ref >60.00)
GLUCOSE: 101 mg/dL — AB (ref 70–99)
POTASSIUM: 4 meq/L (ref 3.5–5.1)
SODIUM: 141 meq/L (ref 135–145)
Total Bilirubin: 0.9 mg/dL (ref 0.2–1.2)
Total Protein: 6.7 g/dL (ref 6.0–8.3)

## 2017-01-23 LAB — PSA, MEDICARE: PSA: 3.67 ng/mL (ref 0.10–4.00)

## 2017-01-23 NOTE — Progress Notes (Signed)
OFFICE VISIT  01/23/2017   CC:  Chief Complaint  Patient presents with  . Follow-up    RCI, pt is fasting.    HPI:    Patient is a 71 y.o.  male who presents for follow up, hx of TIAs. I last saw him 11/2015 for health maintenance exam.  Has been taking 1/4 of 81 mg aspirin.  Denies any neurologic sx's. He eats a healthy diet and works out 3 days/week at [employer] and goes hiking 7 miles weekly.  Desires prostate screening today.  Past Medical History:  Diagnosis Date  . Allergy   . 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 Hepatitis A 1968  . TIA (transient ischemic attack) 12/2012   July.  Right thumb numbness and dysarthria.  Full w/u (EKG, echo, carotids, MRI brain) unrevealing; at the time was on no ASA, was put on 1/4 ASA qd after and has been fine.    Past Surgical History:  Procedure Laterality Date  . 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 COLONOSCOPY  2007; 02/2016   Normal 2007.  2017 repeat showed left colon diverticulosis and internal hemorrhoids, otherwise normal.  No repeat recommended due to pt age.  Marland Kitchen [place]    Outpatient Medications Prior to Visit  Medication Sig Dispense Refill  . aspirin 81 MG tablet Take 81 mg by mouth daily. Takes 1/4 tablet daily    . acetaZOLAMIDE (DIAMOX) 125 MG tablet 1 tab po bid: start the day prior to ascent to high altitude, and stop after you have stayed at the same altitude for 3 days. (Patient not taking: Reported on 01/23/2017) 10 tablet 0  . 0.9 %  sodium chloride infusion      No facility-administered medications prior to visit.     No Known Allergies  ROS As per HPI  PE: Blood pressure 99/62, pulse (!) 58, temperature 98.1 F (36.7 C), temperature source Oral, resp. rate 16, height 5\' 10"  (1.778 m), weight 174 lb 4 oz (79 kg), SpO2 97 %. Gen: Alert, well appearing.  Patient is oriented to person, place, time, and situation. AFFECT: pleasant, lucid thought and speech. Neuro:  CN 2-12 intact bilaterally, strength 5/5 in proximal and distal upper extremities and lower extremities bilaterally.  No sensory deficits.  No tremor.  No disdiadochokinesis.  No ataxia.  Upper extremity and lower extremity DTRs symmetric.  No pronator drift. Rectal exam: negative without mass, lesions or tenderness, PROSTATE EXAM: smooth and symmetric without nodules or tenderness.   LABS:  No results found for: TSH Lab Results  Component Value Date   WBC 5.0 11/17/2015   HGB 14.9 11/17/2015   HCT 45.5 11/17/2015   MCV 92.3 11/17/2015   PLT 182 11/17/2015   Lab Results  Component Value Date   CREATININE 1.14 11/17/2015   BUN 15 11/17/2015   NA 138 11/17/2015   K 4.2 11/17/2015   CL 102 11/17/2015   CO2 26 11/17/2015   Lab Results  Component Value Date   ALT 15 11/17/2015   AST 16 11/17/2015   ALKPHOS 57 11/17/2015   BILITOT 0.9 11/17/2015   Lab Results  Component Value Date   CHOL 170 11/17/2015   Lab Results  Component Value Date   HDL 48 11/17/2015   Lab Results  Component Value Date   LDLCALC 110 11/17/2015   Lab Results  Component Value Date   TRIG 62 11/17/2015  Lab Results  Component Value Date   CHOLHDL 3.5 11/17/2015   Lab Results  Component Value Date   PSA 2.92 11/17/2015   PSA 3.78 11/03/2014   Lab Results  Component Value Date   HGBA1C 5.5 11/05/2014   IMPRESSION AND PLAN:  1) Hx of TIAs.  None since 2014. I feel like screening for diabetes and hyperlipidemia is pertinent in this pt due to his hx of TIAs. Pt agreed.  CMET and FLP done today. Continue 1/4 of an 81mg  ASA.  2) Prostate ca screening/FH of prostate ca in father (in his 18s, but aggressive per pt): DRE normal today. PSA drawn.  An After Visit Summary was printed and given to the patient.  FOLLOW UP: Return in about 1 year (around 01/23/2018) for f/u TIAs/prost ca scr.  Also, appt with Maudie Mercury for AWV at [employer].  Signed:  Crissie Sickles, MD           01/23/2017

## 2017-03-09 DIAGNOSIS — Z23 Encounter for immunization: Secondary | ICD-10-CM | POA: Diagnosis not present

## 2017-07-24 ENCOUNTER — Encounter: Payer: Self-pay | Admitting: Family Medicine

## 2017-07-24 ENCOUNTER — Ambulatory Visit (INDEPENDENT_AMBULATORY_CARE_PROVIDER_SITE_OTHER): Payer: Medicare Other | Admitting: Family Medicine

## 2017-07-24 ENCOUNTER — Ambulatory Visit: Payer: Medicare Other | Admitting: Family Medicine

## 2017-07-24 VITALS — BP 118/68 | HR 80 | Temp 97.8°F | Resp 20 | Wt 182.2 lb

## 2017-07-24 DIAGNOSIS — H6982 Other specified disorders of Eustachian tube, left ear: Secondary | ICD-10-CM | POA: Diagnosis not present

## 2017-07-24 NOTE — Patient Instructions (Addendum)
Perform the Galbreath maneuver a few times a day. Start Flonase nasal spray.   I suspect this is  inner ear dysfunction.   Eustachian Tube Dysfunction The eustachian tube connects the middle ear to the back of the nose. It regulates air pressure in the middle ear by allowing air to move between the ear and nose. It also helps to drain fluid from the middle ear space. When the eustachian tube does not function properly, air pressure, fluid, or both can build up in the middle ear. Eustachian tube dysfunction can affect one or both ears. What are the causes? This condition happens when the eustachian tube becomes blocked or cannot open normally. This may result from:  Ear infections.  Colds and other upper respiratory infections.  Allergies.  Irritation, such as from cigarette smoke or acid from the stomach coming up into the esophagus (gastroesophageal reflux).  Sudden changes in air pressure, such as from descending in an airplane.  Abnormal growths in the nose or throat, such as nasal polyps, tumors, or enlarged tissue at the back of the throat (adenoids).  What increases the risk? This condition may be more likely to develop in people who smoke and people who are overweight. Eustachian tube dysfunction may also be more likely to develop in children, especially children who have:  Certain birth defects of the mouth, such as cleft palate.  Large tonsils and adenoids.  What are the signs or symptoms? Symptoms of this condition may include:  A feeling of fullness in the ear.  Ear pain.  Clicking or popping noises in the ear.  Ringing in the ear.  Hearing loss.  Loss of balance.  Symptoms may get worse when the air pressure around you changes, such as when you travel to an area of high elevation or fly on an airplane. How is this diagnosed? This condition may be diagnosed based on:  Your symptoms.  A physical exam of your ear, nose, and throat.  Tests, such as those  that measure: ? The movement of your eardrum (tympanogram). ? Your hearing (audiometry).  How is this treated? Treatment depends on the cause and severity of your condition. If your symptoms are mild, you may be able to relieve your symptoms by moving air into ("popping") your ears. If you have symptoms of fluid in your ears, treatment may include:  Decongestants.  Antihistamines.  Nasal sprays or ear drops that contain medicines that reduce swelling (steroids).  In some cases, you may need to have a procedure to drain the fluid in your eardrum (myringotomy). In this procedure, a small tube is placed in the eardrum to:  Drain the fluid.  Restore the air in the middle ear space.  Follow these instructions at home:  Take over-the-counter and prescription medicines only as told by your health care provider.  Use techniques to help pop your ears as recommended by your health care provider. These may include: ? Chewing gum. ? Yawning. ? Frequent, forceful swallowing. ? Closing your mouth, holding your nose closed, and gently blowing as if you are trying to blow air out of your nose.  Do not do any of the following until your health care provider approves: ? Travel to high altitudes. ? Fly in airplanes. ? Work in a Pension scheme manager or room. ? Scuba dive.  Keep your ears dry. Dry your ears completely after showering or bathing.  Do not smoke.  Keep all follow-up visits as told by your health care provider. This is important.  Contact a health care provider if:  Your symptoms do not go away after treatment.  Your symptoms come back after treatment.  You are unable to pop your ears.  You have: ? A fever. ? Pain in your ear. ? Pain in your head or neck. ? Fluid draining from your ear.  Your hearing suddenly changes.  You become very dizzy.  You lose your balance. This information is not intended to replace advice given to you by your health care provider. Make sure  you discuss any questions you have with your health care provider. Document Released: 07/16/2015 Document Revised: 11/25/2015 Document Reviewed: 07/08/2014 Elsevier Interactive Patient Education  [employer].

## 2017-07-24 NOTE — Progress Notes (Signed)
Richard Macdonald , 12-15-1945, 72 y.o., male MRN: [id number] Patient Care Team    Relationship Specialty Notifications Start End  McGowen, Adrian Blackwater, MD PCP - General Family Medicine  07/29/14   Doran Stabler, MD Consulting Physician Gastroenterology  02/14/16     Chief Complaint  Patient presents with  . Ear Pain    x 2 weeks( left)     Subjective: Pt presents for an OV with complaints of left ear pain of 2 weeks duration.  Associated symptoms include ear fullness. Patient reports symptoms started after change in altitude on his trip to the mountains. He denies any changes in his hearing, he does wear bilateral hearing aids. He denies any ringing of the ears. 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 He denies any fever, chills, nausea, vomit or sinus pressure. He has been using an over-the-counter ear oil, which he does not feel is helpful.  Depression screen [facility] 2/9 11/17/2015 11/03/2014  Decreased Interest 0 0  Down, Depressed, Hopeless 0 0  PHQ - 2 Score 0 0    No Known Allergies Social History   Tobacco Use  . Smoking status: Former Smoker    Last attempt to quit: 07/04/1971    Years since quitting: 46.0  . Smokeless tobacco: Never Used  Substance Use Topics  . Alcohol use: Yes    Alcohol/week: 2.4 oz    Types: 4 Glasses of wine per week    Comment: 3-4 days a week   Past Medical History:  Diagnosis Date  . Allergy   . 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 Hepatitis A 1968  . TIA (transient ischemic attack) 12/2012   July.  Right thumb numbness and dysarthria.  Full w/u (EKG, echo, carotids, MRI brain) unrevealing; at the time was on no ASA, was put on 1/4 ASA qd after and has been fine.   Past Surgical History:  Procedure Laterality Date  . 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 COLONOSCOPY  2007; 02/2016   Normal 2007.  2017 repeat showed left colon diverticulosis and internal hemorrhoids, otherwise  normal.  No repeat recommended due to pt age.  Marland Kitchen HEMORRHOID SURGERY  1999   Family History  Problem Relation Age of Onset  . Prostate cancer Father   . Breast cancer Sister   . Colon cancer Neg Hx   . Colon polyps Neg Hx   . Esophageal cancer Neg Hx   . Rectal cancer Neg Hx   . Stomach cancer Neg Hx    Allergies as of 07/24/2017   No Known Allergies     Medication List        Accurate as of 07/24/17  3:15 PM. Always use your most recent med list.          aspirin 81 MG tablet Take 81 mg by mouth daily. Takes 1/4 tablet daily       All past medical history, surgical history, allergies, family history, immunizations andmedications were updated in the EMR today and reviewed under the history and medication portions of their EMR.     ROS: Negative, with the exception of above mentioned in HPI   Objective:  BP 118/68 (BP Location: Right Arm, Patient Position: Sitting, Cuff Size: Normal)   Pulse 80   Temp 97.8 F (36.6 C)   Resp 20   Wt 182 lb 4 oz (82.7  kg)   SpO2 98%   BMI 26.15 kg/m  Body mass index is 26.15 kg/m. Gen: Afebrile. No acute distress. Nontoxic in appearance, well developed, well nourished.  HENT: AT. [place]. Bilateral TM visualized without erythema or fullness. External auditory canal normal bilaterally without debris or impaction. MMM, no oral lesions. Bilateral nares without erythema, swelling or drainage. Throat without erythema or exudates. No cough. No hoarseness. No sinus pressure. Eyes:Pupils Equal Round Reactive to light, Extraocular movements intact,  Conjunctiva without redness, discharge or icterus. Neck/lymp/endocrine: Supple, no lymphadenopathy  No exam data present No results found. No results found for this or any previous visit (from the past 24 hour(s)).  Assessment/Plan: Richard Macdonald is a 72 y.o. male present for OV for  Dysfunction of left eustachian tube - Discussed likely diagnosis of eustachian tube dysfunction. His exam was  normal today. No signs of infection. - Patient was encouraged to start Flonase nasal spray. - Galbreath maneuver was taught to patient today and he was encouraged to perform a few times throughout the day. - Could consider steroid burst, patient will like to try the Flonase first. - Follow-up in 2 weeks if symptoms are not improving, or if they worsen.    Reviewed expectations re: course of current medical issues.  Discussed self-management of symptoms.  Outlined signs and symptoms indicating need for more acute intervention.  Patient verbalized understanding and all questions were answered.  Patient received an After-Visit Summary.    No orders of the defined types were placed in this encounter.    Note is dictated utilizing voice recognition software. Although note has been proof read prior to signing, occasional typographical errors still can be missed. If any questions arise, please do not hesitate to call for verification.   electronically signed by:  Howard Pouch, DO  [place]

## 2017-08-08 ENCOUNTER — Telehealth: Payer: Self-pay | Admitting: Family Medicine

## 2017-08-08 NOTE — Telephone Encounter (Signed)
Copied from [place]. Topic: Quick Communication - See Telephone Encounter >> Aug 08, 2017  1:19 PM Hewitt Shorts wrote: CRM for notification. See Telephone encounter for:pt would like to talk with dr Raoul Pitch regarding the ear pain it is on with the help of the flonsase but when patient chews on that side there is some ear pain in the back of the jaw and pt would like to talk with provider to see about possible tooth issue or not  Best number [phone]   08/08/17.

## 2017-08-08 NOTE — Telephone Encounter (Signed)
Spoke with patient reviewed patient AVS advised him Dr Raoul Pitch had instructed patient to follow up with Dr Anitra Lauth if no improvement. Patient verbalized understanding. Mickel Baas scheduled patient appt.

## 2017-08-09 ENCOUNTER — Ambulatory Visit (INDEPENDENT_AMBULATORY_CARE_PROVIDER_SITE_OTHER): Payer: Medicare Other | Admitting: Family Medicine

## 2017-08-09 ENCOUNTER — Encounter: Payer: Self-pay | Admitting: Family Medicine

## 2017-08-09 VITALS — BP 91/56 | HR 64 | Temp 98.3°F | Resp 16 | Ht 70.0 in | Wt 181.2 lb

## 2017-08-09 DIAGNOSIS — M26622 Arthralgia of left temporomandibular joint: Secondary | ICD-10-CM | POA: Diagnosis not present

## 2017-08-09 MED ORDER — ZOSTER VAC RECOMB ADJUVANTED 50 MCG/0.5ML IM SUSR: 0.5000 mL | Freq: Once | INTRAMUSCULAR | 1 refills | Status: AC

## 2017-08-09 NOTE — Patient Instructions (Addendum)
Take 2 otc aleve twice per day for 10 days.   Jaw Range of Motion Exercises Jaw range of motion exercises are exercises that help your jaw to move better. These exercises can help to prevent:  Difficulty opening your mouth.  Pain in your jaw while it is both open and closed.  What should I be careful of when doing jaw exercises? Make sure that you only do jaw exercises as directed by your health care provider. You should only move your jaw as far as it can go in each direction, if told to do so by your health care provider. Do not move your jaw into positions that cause you any pain. What exercises should I do?  Stick your jaw forward. Hold this position for 1-2 seconds. Allow your jaw to return to its normal position and rest it there for 1-2 seconds. Do this exercise 8 times.  Stand or sit in front of a mirror. Place your tongue on the roof of your mouth, just behind your top teeth. Slowly open and close your jaw, keeping your tongue on the roof of your mouth. While you open and close your mouth, try to keep your jaw from moving toward one side or the other. Repeat this 8 times.  Move your jaw right. Hold this position for 1-2 seconds. Allow your jaw to return to its normal position, and rest it there for 1-2 seconds. Do this exercise 8 times.  Move your jaw left. Hold this position for 1-2 seconds. Allow your jaw to return to its normal position, and rest it there for 1-2 seconds. Do this exercise 8 times.  Open your mouth as far as it is can comfortably go. Hold this position for 1-2 seconds. Then close your mouth and rest for 1-2 seconds. Do this exercise 8 times.  Move your jaw in a circular motion, starting toward the right (clockwise). Repeat this 8 times.  Move your jaw in a circular motion, starting toward the left (counterclockwise). Repeat this 8 times. Apply moist heat packs or ice packs to your jaw before or after performing your exercises as directed by your health care  provider. What else can I do? Avoid the following, if they cause jaw pain or they increase your jaw pain:  Chewing gum.  Clenching your jaw or teeth or keeping tension in your jaw muscles.  Leaning on your jaw, such as resting your jaw in your hand while leaning on a desk.  This information is not intended to replace advice given to you by your health care provider. Make sure you discuss any questions you have with your health care provider. Document Released: 06/01/2008 Document Revised: 11/25/2015 Document Reviewed: 05/20/2014 Elsevier Interactive Patient Education  [employer].

## 2017-08-09 NOTE — Progress Notes (Signed)
OFFICE VISIT  08/09/2017   CC:  Chief Complaint  Patient presents with  . Follow-up    ear and jaw pain     HPI:    Patient is a 72 y.o.  male who presents for 2 week f/u L ear pain, dx'd with L eustachian tube dysfunction by Dr. Raoul Pitch at last visit, rx'd flonase. Has discomfort in TMJ area, not in ear--only when chewing.  No help from flonase.  Hearing unchanged.   No NSAIDs taken.  No known teeth grinding habit.  Past Medical History:  Diagnosis Date  . Allergy   . 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 Hepatitis A 1968  . TIA (transient ischemic attack) 12/2012   July.  Right thumb numbness and dysarthria.  Full w/u (EKG, echo, carotids, MRI brain) unrevealing; at the time was on no ASA, was put on 1/4 ASA qd after and has been fine.    Past Surgical History:  Procedure Laterality Date  . 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 COLONOSCOPY  2007; 02/2016   Normal 2007.  2017 repeat showed left colon diverticulosis and internal hemorrhoids, otherwise normal.  No repeat recommended due to pt age.  Marland Kitchen [place]    Outpatient Medications Prior to Visit  Medication Sig Dispense Refill  . aspirin 81 MG tablet Take 81 mg by mouth daily.      No facility-administered medications prior to visit.     No Known Allergies  ROS As per HPI  PE: Blood pressure (!) 91/56, pulse 64, temperature 98.3 F (36.8 C), temperature source Oral, resp. rate 16, height 5\' 10"  (1.778 m), weight 181 lb 4 oz (82.2 kg), SpO2 96 %. Gen: Alert, well appearing.  Patient is oriented to person, place, time, and situation. AFFECT: pleasant, lucid thought and speech. ENT: Ears: EACs clear, normal epithelium.  TMs with good light reflex and landmarks bilaterally.  Eyes: no injection, icteris, swelling, or exudate.  EOMI, PERRLA. Nose: no drainage or turbinate edema/swelling.  No injection or focal lesion.  Mouth: lips without lesion/swelling.  Oral mucosa pink and  moist.  Dentition intact and without obvious caries or gingival swelling.  Oropharynx without erythema, exudate, or swelling.  TMJ's with normal ROM, no subluxation or click/pop is audible or palpable. He has mild discomfort with palpation of L TMJ.  No erythema or swelling of TMJ. No parotid gland tenderness.   LABS:    Chemistry      Component Value Date/Time   NA 141 01/23/2017 0824   K 4.0 01/23/2017 0824   CL 106 01/23/2017 0824   CO2 30 01/23/2017 0824   BUN 16 01/23/2017 0824   CREATININE 1.04 01/23/2017 0824   CREATININE 1.14 11/17/2015 0952      Component Value Date/Time   CALCIUM 9.2 01/23/2017 0824   ALKPHOS 51 01/23/2017 0824   AST 13 01/23/2017 0824   ALT 11 01/23/2017 0824   BILITOT 0.9 01/23/2017 0824     Lab Results  Component Value Date   HGBA1C 5.5 11/05/2014    IMPRESSION AND PLAN:  Left TMJ arthralgia. Aleve 440 mg bid x 10d. ROM exercises printed out and discussed with pt.  An After Visit Summary was printed and given to the patient.   FOLLOW UP: Return for as needed.  Signed:  Crissie Sickles, MD           08/09/2017

## 2018-04-18 DIAGNOSIS — H2513 Age-related nuclear cataract, bilateral: Secondary | ICD-10-CM | POA: Diagnosis not present

## 2018-04-18 DIAGNOSIS — H52203 Unspecified astigmatism, bilateral: Secondary | ICD-10-CM | POA: Diagnosis not present

## 2018-04-18 DIAGNOSIS — H5213 Myopia, bilateral: Secondary | ICD-10-CM | POA: Diagnosis not present

## 2018-04-18 DIAGNOSIS — H524 Presbyopia: Secondary | ICD-10-CM | POA: Diagnosis not present

## 2018-04-29 DIAGNOSIS — Z23 Encounter for immunization: Secondary | ICD-10-CM | POA: Diagnosis not present

## 2018-05-21 ENCOUNTER — Other Ambulatory Visit: Payer: Self-pay

## 2018-06-18 DIAGNOSIS — M25561 Pain in right knee: Secondary | ICD-10-CM | POA: Diagnosis not present

## 2018-08-05 ENCOUNTER — Encounter: Payer: Self-pay | Admitting: Family Medicine

## 2018-08-05 ENCOUNTER — Ambulatory Visit (INDEPENDENT_AMBULATORY_CARE_PROVIDER_SITE_OTHER): Payer: Medicare Other | Admitting: Family Medicine

## 2018-08-05 VITALS — BP 96/58 | HR 59 | Temp 98.1°F | Resp 16 | Ht 70.0 in | Wt 179.0 lb

## 2018-08-05 DIAGNOSIS — Z125 Encounter for screening for malignant neoplasm of prostate: Secondary | ICD-10-CM

## 2018-08-05 DIAGNOSIS — Z8673 Personal history of transient ischemic attack (TIA), and cerebral infarction without residual deficits: Secondary | ICD-10-CM

## 2018-08-05 DIAGNOSIS — Z131 Encounter for screening for diabetes mellitus: Secondary | ICD-10-CM

## 2018-08-05 LAB — CBC WITH DIFFERENTIAL/PLATELET
BASOS ABS: 0 10*3/uL (ref 0.0–0.1)
Basophils Relative: 0.7 % (ref 0.0–3.0)
EOS ABS: 0.2 10*3/uL (ref 0.0–0.7)
Eosinophils Relative: 3.8 % (ref 0.0–5.0)
HEMATOCRIT: 43 % (ref 39.0–52.0)
Hemoglobin: 14.4 g/dL (ref 13.0–17.0)
LYMPHS PCT: 32.3 % (ref 12.0–46.0)
Lymphs Abs: 1.6 10*3/uL (ref 0.7–4.0)
MCHC: 33.5 g/dL (ref 30.0–36.0)
MCV: 90.9 fl (ref 78.0–100.0)
MONOS PCT: 9.6 % (ref 3.0–12.0)
Monocytes Absolute: 0.5 10*3/uL (ref 0.1–1.0)
Neutro Abs: 2.6 10*3/uL (ref 1.4–7.7)
Neutrophils Relative %: 53.6 % (ref 43.0–77.0)
Platelets: 159 10*3/uL (ref 150.0–400.0)
RBC: 4.73 Mil/uL (ref 4.22–5.81)
RDW: 14 % (ref 11.5–15.5)
WBC: 4.8 10*3/uL (ref 4.0–10.5)

## 2018-08-05 LAB — COMPREHENSIVE METABOLIC PANEL
ALBUMIN: 4.4 g/dL (ref 3.5–5.2)
ALT: 21 U/L (ref 0–53)
AST: 15 U/L (ref 0–37)
Alkaline Phosphatase: 47 U/L (ref 39–117)
BILIRUBIN TOTAL: 0.7 mg/dL (ref 0.2–1.2)
BUN: 22 mg/dL (ref 6–23)
CALCIUM: 9.4 mg/dL (ref 8.4–10.5)
CHLORIDE: 104 meq/L (ref 96–112)
CO2: 30 meq/L (ref 19–32)
CREATININE: 1.17 mg/dL (ref 0.40–1.50)
GFR: 61.12 mL/min (ref >60.00)
Glucose, Bld: 96 mg/dL (ref 70–99)
Potassium: 4.8 mEq/L (ref 3.5–5.1)
Sodium: 139 mEq/L (ref 135–145)
Total Protein: 6.9 g/dL (ref 6.0–8.3)

## 2018-08-05 LAB — PSA, MEDICARE: PSA: 3.12 ng/ml (ref 0.10–4.00)

## 2018-08-05 NOTE — Progress Notes (Signed)
Office Note 08/05/2018  CC:  Chief Complaint  Patient presents with  . Follow-up    RCI  He is fasting.  HPI:  Richard Macdonald is a 73 y.o.  male who is her for 1 yr f/u.  Has hx of TIA.  Takes 81 mg ASA qd.  Last time I saw him he was having TMJ dysfunction pain.  Since I last saw him he had a R knee torn meniscus. Meloxicam a couple days a week avg when he goes to the GYM Does X training, resistance training, stretches a lot.  Diet: says it is all healthy.  ROS: no CP, no SOB, no wheezing, no cough, no dizziness, no HAs, no rashes, no melena/hematochezia.  No polyuria or polydipsia.  No myalgias or arthralgias.  Past Medical History:  Diagnosis Date  . Allergy   . 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 Hepatitis A 1968  . TIA (transient ischemic attack) 12/2012   July.  Right thumb numbness and dysarthria.  Full w/u (EKG, echo, carotids, MRI brain) unrevealing; at the time was on no ASA, was put on 1/4 ASA qd after and has been fine.  . Torn meniscus    R knee.  Dr. Oneal Grout.    Past Surgical History:  Procedure Laterality Date  . 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 COLONOSCOPY  2007; 02/2016   Normal 2007.  2017 repeat showed left colon diverticulosis and internal hemorrhoids, otherwise normal.  No repeat recommended due to pt age.  Marland Kitchen HEMORRHOID SURGERY  1999    Family History  Problem Relation Age of Onset  . Prostate cancer Father   . Breast cancer Sister   . Colon cancer Neg Hx   . Colon polyps Neg Hx   . Esophageal cancer Neg Hx   . Rectal cancer Neg Hx   . Stomach cancer Neg Hx     Social History   Socioeconomic History  . Marital status: Married    Spouse name: Not on file  . Number of children: Not on file  . Years of education: Not on file  . Highest education level: Not on file  Occupational History  . Not on file  Social Needs  . Financial resource strain: Not on file  . Food insecurity:    Worry: Not on file     Inability: Not on file  . Transportation needs:    Medical: Not on file    Non-medical: Not on file  Tobacco Use  . Smoking status: Former Smoker    Last attempt to quit: 07/04/1971    Years since quitting: 47.1  . Smokeless tobacco: Never Used  Substance and Sexual Activity  . Alcohol use: Yes    Alcohol/week: 4.0 standard drinks    Types: 4 Glasses of wine per week    Comment: 3-4 days a week  . Drug use: No  . Sexual activity: Not on file  Lifestyle  . Physical activity:    Days per week: Not on file    Minutes per session: Not on file  . Stress: Not on file  Relationships  . Social connections:    Talks on phone: Not on file    Gets together: Not on file    Attends religious service: Not on file    Active member of club or organization: Not on file    Attends meetings of clubs or organizations: Not on file  Relationship status: Not on file  . Intimate partner violence:    Fear of current or ex partner: Not on file    Emotionally abused: Not on file    Physically abused: Not on file    Forced sexual activity: Not on file  Other Topics Concern  . Not on file  Social History Narrative   Married, 3 children in their 62s.   College educated.   Occupation: retired from [employer] (42 yrs).   Former smoker: 10 pack-yr hx, quit 1973.   Alcohol: 4-5 glasses of wine per week.   Exercises 3 days/week minimum x 1.5 hours; [employer], bike, hiking.    Outpatient Medications Prior to Visit  Medication Sig Dispense Refill  . aspirin 81 MG tablet Take 81 mg by mouth daily.     . meloxicam (MOBIC) 15 MG tablet Take 15 mg by mouth daily.     No facility-administered medications prior to visit.     No Known Allergies  ROS Review of Systems  Constitutional: Negative for appetite change, chills, fatigue and fever.  HENT: Negative for congestion, dental problem, ear pain and sore throat.   Eyes: Negative for discharge, redness and visual disturbance.   Respiratory: Negative for cough, chest tightness, shortness of breath and wheezing.   Cardiovascular: Negative for chest pain, palpitations and leg swelling.  Gastrointestinal: Negative for abdominal pain, blood in stool, diarrhea, nausea and vomiting.  Genitourinary: Negative for difficulty urinating, dysuria, flank pain, frequency, hematuria and urgency.  Musculoskeletal: Positive for arthralgias (intermittent R knee pain). Negative for back pain, joint swelling, myalgias and neck stiffness.  Skin: Negative for pallor and rash.  Neurological: Negative for dizziness, speech difficulty, weakness and headaches.  Hematological: Negative for adenopathy. Does not bruise/bleed easily.  Psychiatric/Behavioral: Negative for confusion and sleep disturbance. The patient is not nervous/anxious.     PE; Blood pressure (!) 96/58, pulse (!) 59, temperature 98.1 F (36.7 C), temperature source Oral, resp. rate 16, height 5\' 10"  (1.778 m), weight 179 lb (81.2 kg), SpO2 96 %. Gen: Alert, well appearing.  Patient is oriented to person, place, time, and situation. AFFECT: pleasant, lucid thought and speech. CV: RRR, no m/r/g.   LUNGS: CTA bilat, nonlabored resps, good aeration in all lung fields. ABD: soft, NT/ND EXT: no clubbing or cyanosis.  no edema.  Rectal: deferred Skin - no sores or suspicious lesions or rashes or color changes   Pertinent labs:  No results found for: TSH Lab Results  Component Value Date   WBC 5.0 11/17/2015   HGB 14.9 11/17/2015   HCT 45.5 11/17/2015   MCV 92.3 11/17/2015   PLT 182 11/17/2015   Lab Results  Component Value Date   CREATININE 1.04 01/23/2017   BUN 16 01/23/2017   NA 141 01/23/2017   K 4.0 01/23/2017   CL 106 01/23/2017   CO2 30 01/23/2017   Lab Results  Component Value Date   ALT 11 01/23/2017   AST 13 01/23/2017   ALKPHOS 51 01/23/2017   BILITOT 0.9 01/23/2017   Lab Results  Component Value Date   CHOL 156 01/23/2017   Lab Results   Component Value Date   HDL 45.90 01/23/2017   Lab Results  Component Value Date   LDLCALC 97 01/23/2017   Lab Results  Component Value Date   TRIG 67.0 01/23/2017   Lab Results  Component Value Date   CHOLHDL 3 01/23/2017   Lab Results  Component Value Date   PSA 3.67 01/23/2017  PSA 2.92 11/17/2015   PSA 3.78 11/03/2014      ASSESSMENT AND PLAN:   1) Hx of TIA: continue ASA.  Monitor for RF's: screen for DM today.  CMET today.  Insurance would not allow FLP today so he said that was fine he didn't want it.  2) Pr ca scr: PSA today.  Will do DRE with PSA at next f/u 1 yr.   3) Colon ca screening: last colonoscopy was 2017--> no further colonoscopies recommended due to age.  An After Visit Summary was printed and given to the patient.  FOLLOW UP:  Return in about 1 year (around 08/06/2019) for  follow up/exam.  Signed:  Crissie Sickles, MD           08/05/2018

## 2019-04-13 DIAGNOSIS — Z23 Encounter for immunization: Secondary | ICD-10-CM | POA: Diagnosis not present

## 2019-05-19 DIAGNOSIS — Z20828 Contact with and (suspected) exposure to other viral communicable diseases: Secondary | ICD-10-CM | POA: Diagnosis not present

## 2019-05-23 ENCOUNTER — Other Ambulatory Visit: Payer: Self-pay

## 2019-07-04 DIAGNOSIS — M7021 Olecranon bursitis, right elbow: Secondary | ICD-10-CM

## 2019-07-04 HISTORY — DX: Olecranon bursitis, right elbow: M70.21

## 2019-07-23 ENCOUNTER — Ambulatory Visit: Payer: Medicare Other | Attending: Internal Medicine

## 2019-07-23 DIAGNOSIS — Z23 Encounter for immunization: Secondary | ICD-10-CM | POA: Diagnosis not present

## 2019-07-23 NOTE — Progress Notes (Signed)
   Covid-19 Vaccination Clinic  Name:  Richard Macdonald    MRN: [id number] DOB: 20-May-1946  07/23/2019  [name] was observed post Covid-19 immunization for 15 minutes without incidence. He was provided with Vaccine Information Sheet and instruction to access the V-Safe system.   [name] was instructed to call 911 with any severe reactions post vaccine: Marland Kitchen Difficulty breathing  . Swelling of your face and throat  . A fast heartbeat  . A bad rash all over your body  . Dizziness and weakness    Immunizations Administered    Name Date Dose VIS Date Route   Pfizer COVID-19 Vaccine 07/23/2019 11:03 AM 0.3 mL 06/13/2019 Intramuscular   Manufacturer: [place]   Lot: [id number]   [place]: [id number]

## 2019-08-11 ENCOUNTER — Ambulatory Visit: Payer: Medicare Other | Attending: Internal Medicine

## 2019-08-11 DIAGNOSIS — Z23 Encounter for immunization: Secondary | ICD-10-CM | POA: Insufficient documentation

## 2019-08-11 NOTE — Progress Notes (Signed)
   Covid-19 Vaccination Clinic  Name:  Richard Macdonald    MRN: [id number] DOB: [birth year]  08/11/2019  [name] was observed post Covid-19 immunization for 15 minutes without incidence. He was provided with Vaccine Information Sheet and instruction to access the V-Safe system.   [name] was instructed to call 911 with any severe reactions post vaccine: Marland Kitchen Difficulty breathing  . Swelling of your face and throat  . A fast heartbeat  . A bad rash all over your body  . Dizziness and weakness    Immunizations Administered    Name Date Dose VIS Date Route   Pfizer COVID-19 Vaccine 08/11/2019  5:22 PM 0.3 mL 06/13/2019 Intramuscular   Manufacturer: [place]   Lot: [phone] [id number]   [place]: [id number]

## 2019-10-16 ENCOUNTER — Ambulatory Visit (INDEPENDENT_AMBULATORY_CARE_PROVIDER_SITE_OTHER): Payer: Medicare Other | Admitting: Family Medicine

## 2019-10-16 ENCOUNTER — Encounter: Payer: Self-pay | Admitting: Family Medicine

## 2019-10-16 ENCOUNTER — Other Ambulatory Visit: Payer: Self-pay

## 2019-10-16 VITALS — BP 124/74 | HR 62 | Temp 98.3°F | Resp 16 | Ht 70.0 in | Wt 181.0 lb

## 2019-10-16 DIAGNOSIS — Z8673 Personal history of transient ischemic attack (TIA), and cerebral infarction without residual deficits: Secondary | ICD-10-CM | POA: Diagnosis not present

## 2019-10-16 DIAGNOSIS — Z125 Encounter for screening for malignant neoplasm of prostate: Secondary | ICD-10-CM | POA: Diagnosis not present

## 2019-10-16 LAB — COMPREHENSIVE METABOLIC PANEL
ALT: 15 U/L (ref 0–53)
AST: 17 U/L (ref 0–37)
Albumin: 4.2 g/dL (ref 3.5–5.2)
Alkaline Phosphatase: 58 U/L (ref 39–117)
BUN: 20 mg/dL (ref 6–23)
CO2: 29 mEq/L (ref 19–32)
Calcium: 8.8 mg/dL (ref 8.4–10.5)
Chloride: 105 mEq/L (ref 96–112)
Creatinine, Ser: 1.02 mg/dL (ref 0.40–1.50)
GFR: 71.37 mL/min (ref >60.00)
Glucose, Bld: 102 mg/dL — ABNORMAL HIGH (ref 70–99)
Potassium: 4.4 mEq/L (ref 3.5–5.1)
Sodium: 139 mEq/L (ref 135–145)
Total Bilirubin: 0.6 mg/dL (ref 0.2–1.2)
Total Protein: 6.7 g/dL (ref 6.0–8.3)

## 2019-10-16 LAB — PSA, MEDICARE: PSA: 2.71 ng/ml (ref 0.10–4.00)

## 2019-10-16 MED ORDER — ZOSTER VAC RECOMB ADJUVANTED 50 MCG/0.5ML IM SUSR: 0.5000 mL | Freq: Once | INTRAMUSCULAR | 0 refills | Status: AC

## 2019-10-16 NOTE — Progress Notes (Signed)
Office Note 10/16/2019  CC:  Chief Complaint  Patient presents with  . Annual Exam    pt is fasting    HPI:  Richard Macdonald is a 74 y.o. male who is here for annual f/u for hx of TIA, FH prostate cancer, and review of health maintenance needs.  Hx of TIA about 7 yrs ago, takes 81mg  ASA qd.  Everything going fine. No knee pain, not taking meloxicam.  Exercising regularly. Spent some time traveling around [place]. recently to visit some of his adult children.   Past Medical History:  Diagnosis Date  . Allergy   . 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 Hepatitis A 1968  . TIA (transient ischemic attack) 12/2012   July.  Right thumb numbness and dysarthria.  Full w/u (EKG, echo, carotids, MRI brain) unrevealing; at the time was on no ASA, was put on 1/4 ASA qd after and has been fine.  . Torn meniscus    R knee.  Dr. Oneal Grout.    Past Surgical History:  Procedure Laterality Date  . 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 COLONOSCOPY  2007; 02/2016   Normal 2007.  2017 repeat showed left colon diverticulosis and internal hemorrhoids, otherwise normal.  No repeat recommended due to pt age.  Marland Kitchen HEMORRHOID SURGERY  1999    Family History  Problem Relation Age of Onset  . Prostate cancer Father   . Breast cancer Sister   . Colon cancer Neg Hx   . Colon polyps Neg Hx   . Esophageal cancer Neg Hx   . Rectal cancer Neg Hx   . Stomach cancer Neg Hx     Social History   Socioeconomic History  . Marital status: Married    Spouse name: Not on file  . Number of children: Not on file  . Years of education: Not on file  . Highest education level: Not on file  Occupational History  . Not on file  Tobacco Use  . Smoking status: Former Smoker    Quit date: 07/04/1971    Years since quitting: 48.3  . Smokeless tobacco: Never Used  Substance and Sexual Activity  . Alcohol use: Yes    Alcohol/week: 4.0 standard drinks    Types: 4 Glasses of wine per week     Comment: 3-4 days a week  . Drug use: No  . Sexual activity: Not on file  Other Topics Concern  . Not on file  Social History Narrative   Married, 3 children in their 24s.   College educated.   Occupation: retired from [employer] (42 yrs).   Former smoker: 10 pack-yr hx, quit 1973.   Alcohol: 4-5 glasses of wine per week.   Exercises 3 days/week minimum x 1.5 hours; [employer], bike, hiking.   Social Determinants of Health   Financial Resource Strain:   . Difficulty of Paying Living Expenses:   Food Insecurity:   . Worried About Charity fundraiser in the Last Year:   . Arboriculturist in the Last Year:   Transportation Needs:   . Film/video editor (Medical):   Marland Kitchen Lack of Transportation (Non-Medical):   Physical Activity:   . Days of Exercise per Week:   . Minutes of Exercise per Session:   Stress:   . Feeling of Stress :   Social Connections:   . Frequency of Communication with Friends and Family:   .  Frequency of Social Gatherings with Friends and Family:   . Attends Religious Services:   . Active Member of Clubs or Organizations:   . Attends Archivist Meetings:   Marland Kitchen Marital Status:   Intimate Partner Violence:   . Fear of Current or Ex-Partner:   . Emotionally Abused:   Marland Kitchen Physically Abused:   . Sexually Abused:     MEDS: ASA 81mg  qd  No Known Allergies  ROS Review of Systems  Constitutional: Negative for appetite change, chills, fatigue and fever.  HENT: Negative for congestion, dental problem, ear pain and sore throat.   Eyes: Negative for discharge, redness and visual disturbance.  Respiratory: Negative for cough, chest tightness, shortness of breath and wheezing.   Cardiovascular: Negative for chest pain, palpitations and leg swelling.  Gastrointestinal: Negative for abdominal pain, blood in stool, diarrhea, nausea and vomiting.  Genitourinary: Negative for difficulty urinating, dysuria, flank pain, frequency, hematuria and  urgency.  Musculoskeletal: Negative for arthralgias, back pain, joint swelling, myalgias and neck stiffness.  Skin: Negative for pallor and rash.  Neurological: Negative for dizziness, speech difficulty, weakness and headaches.  Hematological: Negative for adenopathy. Does not bruise/bleed easily.  Psychiatric/Behavioral: Negative for confusion and sleep disturbance. The patient is not nervous/anxious.     PE; Blood pressure 124/74, pulse 62, temperature 98.3 F (36.8 C), temperature source Temporal, resp. rate 16, height 5\' 10"  (1.778 m), weight 181 lb (82.1 kg), SpO2 97 %. Body mass index is 25.97 kg/m.  Gen: Alert, well appearing.  Patient is oriented to person, place, time, and situation. AFFECT: pleasant, lucid thought and speech. ENT:  Eyes: no injection, icteris, swelling, or exudate.  EOMI, PERRLA. Nose: no drainage or turbinate edema/swelling.  No injection or focal lesion.  Mouth: lips without lesion/swelling.  Oral mucosa pink and moist.  Dentition intact and without obvious caries or gingival swelling.  Oropharynx without erythema, exudate, or swelling.  Neck: supple/nontender.  No LAD, mass, or TM.  Carotid pulses 2+ bilaterally, without bruits. CV: RRR, no m/r/g.   LUNGS: CTA bilat, nonlabored resps, good aeration in all lung fields. ABD: soft, NT, ND, BS normal.  No hepatospenomegaly or mass.  No bruits. EXT: no clubbing, cyanosis, or edema.  Musculoskeletal: no joint swelling, erythema, warmth, or tenderness.  ROM of all joints intact. Skin - no sores or suspicious lesions or rashes or color changes Rectal exam: negative without mass, lesions or tenderness, PROSTATE EXAM: smooth and symmetric without nodules or tenderness, enlarged 3+.   Pertinent labs:  No results found for: TSH Lab Results  Component Value Date   WBC 4.8 08/05/2018   HGB 14.4 08/05/2018   HCT 43.0 08/05/2018   MCV 90.9 08/05/2018   PLT 159.0 08/05/2018   Lab Results  Component Value Date    CREATININE 1.17 08/05/2018   BUN 22 08/05/2018   NA 139 08/05/2018   K 4.8 08/05/2018   CL 104 08/05/2018   CO2 30 08/05/2018   Lab Results  Component Value Date   ALT 21 08/05/2018   AST 15 08/05/2018   ALKPHOS 47 08/05/2018   BILITOT 0.7 08/05/2018   Lab Results  Component Value Date   CHOL 156 01/23/2017   Lab Results  Component Value Date   HDL 45.90 01/23/2017   Lab Results  Component Value Date   LDLCALC 97 01/23/2017   Lab Results  Component Value Date   TRIG 67.0 01/23/2017   Lab Results  Component Value Date   CHOLHDL 3 01/23/2017  Lab Results  Component Value Date   PSA 3.12 08/05/2018   PSA 3.67 01/23/2017   PSA 2.92 11/17/2015   Lab Results  Component Value Date   HGBA1C 5.5 11/05/2014    ASSESSMENT AND PLAN:   Hx of TIA: no recurrence of any neurologic symptoms. He is active and eats healthy. Monitor metabolic panel today. FH of prostate ca/prostate ca screening: DRE normal today.  PSA sent. Colon ca screening: no further screening is indicated in this patient. Vaccines: all UTD.  Shingrix discussed->rx sent to pharmacy today.  An After Visit Summary was printed and given to the patient.  FOLLOW UP:  Return in about 1 year (around 10/15/2020) for annual check up.  Signed:  Crissie Sickles, MD           10/16/2019

## 2019-10-17 DIAGNOSIS — H2513 Age-related nuclear cataract, bilateral: Secondary | ICD-10-CM | POA: Diagnosis not present

## 2019-11-05 ENCOUNTER — Emergency Department (HOSPITAL_COMMUNITY)
Admission: EM | Admit: 2019-11-05 | Discharge: 2019-11-06 | Disposition: A | Discharge status: Home / Self Care | Payer: Medicare Other | Attending: Emergency Medicine | Admitting: Emergency Medicine

## 2019-11-05 ENCOUNTER — Emergency Department (HOSPITAL_COMMUNITY): Payer: Medicare Other

## 2019-11-05 ENCOUNTER — Encounter (HOSPITAL_COMMUNITY): Payer: Self-pay

## 2019-11-05 DIAGNOSIS — B150 Hepatitis A with hepatic coma: Secondary | ICD-10-CM | POA: Diagnosis not present

## 2019-11-05 DIAGNOSIS — H43391 Other vitreous opacities, right eye: Secondary | ICD-10-CM | POA: Insufficient documentation

## 2019-11-05 DIAGNOSIS — Z7982 Long term (current) use of aspirin: Secondary | ICD-10-CM | POA: Insufficient documentation

## 2019-11-05 DIAGNOSIS — Z8673 Personal history of transient ischemic attack (TIA), and cerebral infarction without residual deficits: Secondary | ICD-10-CM | POA: Diagnosis not present

## 2019-11-05 DIAGNOSIS — R9431 Abnormal electrocardiogram [ECG] [EKG]: Secondary | ICD-10-CM | POA: Diagnosis not present

## 2019-11-05 DIAGNOSIS — H538 Other visual disturbances: Secondary | ICD-10-CM | POA: Diagnosis not present

## 2019-11-05 DIAGNOSIS — Z87891 Personal history of nicotine dependence: Secondary | ICD-10-CM | POA: Insufficient documentation

## 2019-11-05 LAB — PROTIME-INR
INR: 1 (ref 0.8–1.2)
Prothrombin Time: 12.9 seconds (ref 11.4–15.2)

## 2019-11-05 LAB — COMPREHENSIVE METABOLIC PANEL
ALT: 16 U/L (ref 0–44)
AST: 17 U/L (ref 15–41)
Albumin: 3.9 g/dL (ref 3.5–5.0)
Alkaline Phosphatase: 63 U/L (ref 38–126)
Anion gap: 9 (ref 5–15)
BUN: 17 mg/dL (ref 8–23)
CO2: 27 mmol/L (ref 22–32)
Calcium: 9.2 mg/dL (ref 8.9–10.3)
Chloride: 103 mmol/L (ref 98–111)
Creatinine, Ser: 0.89 mg/dL (ref 0.61–1.24)
GFR calc Af Amer: >60 mL/min (ref >60)
GFR calc non Af Amer: >60 mL/min (ref >60)
Glucose, Bld: 108 mg/dL — ABNORMAL HIGH (ref 70–99)
Potassium: 3.8 mmol/L (ref 3.5–5.1)
Sodium: 139 mmol/L (ref 135–145)
Total Bilirubin: 0.9 mg/dL (ref 0.3–1.2)
Total Protein: 7.3 g/dL (ref 6.5–8.1)

## 2019-11-05 LAB — I-STAT CHEM 8, ED
BUN: 21 mg/dL (ref 8–23)
Calcium, Ion: 0.93 mmol/L — ABNORMAL LOW (ref 1.15–1.40)
Chloride: 101 mmol/L (ref 98–111)
Creatinine, Ser: 0.9 mg/dL (ref 0.61–1.24)
Glucose, Bld: 104 mg/dL — ABNORMAL HIGH (ref 70–99)
HCT: 42 % (ref 39.0–52.0)
Hemoglobin: 14.3 g/dL (ref 13.0–17.0)
Potassium: 3.9 mmol/L (ref 3.5–5.1)
Sodium: 139 mmol/L (ref 135–145)
TCO2: 29 mmol/L (ref 22–32)

## 2019-11-05 LAB — DIFFERENTIAL
Abs Immature Granulocytes: 0.02 10*3/uL (ref 0.00–0.07)
Basophils Absolute: 0 10*3/uL (ref 0.0–0.1)
Basophils Relative: 0 %
Eosinophils Absolute: 0.1 10*3/uL (ref 0.0–0.5)
Eosinophils Relative: 1 %
Immature Granulocytes: 0 %
Lymphocytes Relative: 19 %
Lymphs Abs: 1.3 10*3/uL (ref 0.7–4.0)
Monocytes Absolute: 0.6 10*3/uL (ref 0.1–1.0)
Monocytes Relative: 9 %
Neutro Abs: 5 10*3/uL (ref 1.7–7.7)
Neutrophils Relative %: 71 %

## 2019-11-05 LAB — CBC
HCT: 42.3 % (ref 39.0–52.0)
Hemoglobin: 13.9 g/dL (ref 13.0–17.0)
MCH: 30.3 pg (ref 26.0–34.0)
MCHC: 32.9 g/dL (ref 30.0–36.0)
MCV: 92.2 fL (ref 80.0–100.0)
Platelets: 181 10*3/uL (ref 150–400)
RBC: 4.59 MIL/uL (ref 4.22–5.81)
RDW: 13.3 % (ref 11.5–15.5)
WBC: 7.1 10*3/uL (ref 4.0–10.5)
nRBC: 0 % (ref 0.0–0.2)

## 2019-11-05 LAB — APTT: aPTT: 28 seconds (ref 24–36)

## 2019-11-05 MED ORDER — SODIUM CHLORIDE 0.9% FLUSH: 3.0000 mL | Freq: Once | INTRAVENOUS | Status: DC

## 2019-11-05 NOTE — ED Triage Notes (Signed)
Pt reports that he had sudden onset of R eye blurry vision and seeing black streaks that started at 630pm. Denies headache, neuro intact bilaterally.

## 2019-11-06 ENCOUNTER — Encounter (INDEPENDENT_AMBULATORY_CARE_PROVIDER_SITE_OTHER): Payer: Self-pay | Admitting: Ophthalmology

## 2019-11-06 ENCOUNTER — Other Ambulatory Visit: Payer: Self-pay

## 2019-11-06 ENCOUNTER — Ambulatory Visit (INDEPENDENT_AMBULATORY_CARE_PROVIDER_SITE_OTHER): Payer: Medicare Other | Admitting: Ophthalmology

## 2019-11-06 DIAGNOSIS — H2513 Age-related nuclear cataract, bilateral: Secondary | ICD-10-CM

## 2019-11-06 DIAGNOSIS — H43813 Vitreous degeneration, bilateral: Secondary | ICD-10-CM | POA: Diagnosis not present

## 2019-11-06 DIAGNOSIS — H538 Other visual disturbances: Secondary | ICD-10-CM | POA: Diagnosis not present

## 2019-11-06 DIAGNOSIS — H33312 Horseshoe tear of retina without detachment, left eye: Secondary | ICD-10-CM

## 2019-11-06 DIAGNOSIS — H33311 Horseshoe tear of retina without detachment, right eye: Secondary | ICD-10-CM | POA: Diagnosis not present

## 2019-11-06 DIAGNOSIS — H43393 Other vitreous opacities, bilateral: Secondary | ICD-10-CM | POA: Diagnosis not present

## 2019-11-06 DIAGNOSIS — H43311 Vitreous membranes and strands, right eye: Secondary | ICD-10-CM

## 2019-11-06 LAB — CBG MONITORING, ED: Glucose-Capillary: 97 mg/dL (ref 70–99)

## 2019-11-06 MED ORDER — FLUORESCEIN SODIUM 1 MG OP STRP
1.0000 | ORAL_STRIP | Freq: Once | OPHTHALMIC | Status: AC
  Administered 2019-11-06: 1 via OPHTHALMIC
  Filled 2019-11-06: qty 1

## 2019-11-06 NOTE — ED Provider Notes (Signed)
[facility] EMERGENCY DEPARTMENT Provider Note  CSN: [id number] Arrival date & time: 11/05/19 2008  Chief Complaint(s) Blurred Vision  HPI Richard Macdonald is a 74 y.o. male   The history is provided by the patient.  Illness Location:  Right eye Quality:  Floaters and blurriness Severity:  Moderate Onset quality:  Sudden Duration: started at 6:30p. Timing:  Constant Progression:  Unchanged Chronicity:  New Relieved by:  Nothing Worsened by:  Nothing Associated symptoms: no chest pain, no cough, no diarrhea, no headaches, no loss of consciousness, no nausea, no rhinorrhea, no shortness of breath and no vomiting     Past Medical History Past Medical History:  Diagnosis Date  . Allergy   . 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 Hepatitis A 1968  . TIA (transient ischemic attack) 12/2012   July.  Right thumb numbness and dysarthria.  Full w/u (EKG, echo, carotids, MRI brain) unrevealing; at the time was on no ASA, was put on 1/4 ASA qd after and has been fine.  . Torn meniscus    R knee.  Dr. Oneal Grout.   Patient Active Problem List   Diagnosis Date Noted  . Screening for hyperlipidemia 11/26/2015  . Health maintenance examination 11/03/2014   Home Medication(s) Prior to Admission medications   Medication Sig Start Date End Date Taking? Authorizing Provider  aspirin 81 MG tablet Take 81 mg by mouth daily.     [provider]                                                                                                                                    Past Surgical History Past Surgical History:  Procedure Laterality Date  . 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 COLONOSCOPY  2007; 02/2016   Normal 2007.  2017 repeat showed left colon diverticulosis and internal hemorrhoids, otherwise normal.  No repeat recommended due to pt age.  Marland Kitchen HEMORRHOID SURGERY  1999   Family History Family History  Problem Relation Age of Onset   . Prostate cancer Father   . Breast cancer Sister   . Colon cancer Neg Hx   . Colon polyps Neg Hx   . Esophageal cancer Neg Hx   . Rectal cancer Neg Hx   . Stomach cancer Neg Hx     Social History Social History   Tobacco Use  . Smoking status: Former Smoker    Quit date: 07/04/1971    Years since quitting: 48.3  . Smokeless tobacco: Never Used  Substance Use Topics  . Alcohol use: Yes    Alcohol/week: 4.0 standard drinks    Types: 4 Glasses of wine per week    Comment: 3-4 days a week  . Drug use: No   Allergies Patient has no known allergies.  Review of Systems Review of Systems  HENT: Negative for rhinorrhea.  Respiratory: Negative for cough and shortness of breath.   Cardiovascular: Negative for chest pain.  Gastrointestinal: Negative for diarrhea, nausea and vomiting.  Neurological: Negative for loss of consciousness and headaches.   All other systems are reviewed and are negative for acute change except as noted in the HPI  Physical Exam Vital Signs  I have reviewed the triage vital signs BP 119/77   Pulse 67   Temp 98.8 F (37.1 C) (Oral)   Resp 13   SpO2 100%   Physical Exam Vitals reviewed.  Constitutional:      General: He is not in acute distress.    Appearance: He is well-developed. He is not diaphoretic.  HENT:     Head: Normocephalic and atraumatic.     Jaw: No trismus.     Right Ear: External ear normal.     Left Ear: External ear normal.     Nose: Nose normal.  Eyes:     General: No scleral icterus.       Right eye: No discharge.        Left eye: No discharge.     Extraocular Movements: Extraocular movements intact.     Conjunctiva/sclera: Conjunctivae normal.     Right eye: Right conjunctiva is not injected. No hemorrhage.    Left eye: Left conjunctiva is not injected. No hemorrhage.    Pupils: Pupils are equal, round, and reactive to light.     Right eye: No fluorescein uptake.     Funduscopic exam:    Right eye: No hemorrhage or  papilledema.     Slit lamp exam:    Right eye: No photophobia.  Neck:     Trachea: Phonation normal.  Cardiovascular:     Rate and Rhythm: Normal rate and regular rhythm.  Pulmonary:     Effort: Pulmonary effort is normal. No respiratory distress.     Breath sounds: No stridor.  Abdominal:     General: There is no distension.  Musculoskeletal:        General: Normal range of motion.     Cervical back: Normal range of motion.  Neurological:     Mental Status: He is alert and oriented to person, place, and time.  Psychiatric:        Behavior: Behavior normal.     ED Results and Treatments Labs (all labs ordered are listed, but only abnormal results are displayed) Labs Reviewed  COMPREHENSIVE METABOLIC PANEL - Abnormal; Notable for the following components:      Result Value   Glucose, Bld 108 (*)    All other components within normal limits  I-STAT CHEM 8, ED - Abnormal; Notable for the following components:   Glucose, Bld 104 (*)    Calcium, Ion 0.93 (*)    All other components within normal limits  PROTIME-INR  APTT  CBC  DIFFERENTIAL  CBG MONITORING, ED  EKG  EKG Interpretation  Date/Time:  Wednesday Nov 05 2019 21:04:38 EDT Ventricular Rate:  77 PR Interval:  154 QRS Duration: 90 QT Interval:  358 QTC Calculation: 405 R Axis:   -24 Text Interpretation: Normal sinus rhythm Normal ECG NO STEMI. No old tracing to compare Confirmed by Addison Lank [phone]) on 11/06/2019 1:58:20 AM      Radiology B-Scan Ultrasound - OD - Right Eye  Result Date: 11/06/2019 Quality was good. Findings included vitreous opacities. Notes Retinal tear at 12:00  Repair Retinal Breaks, Laser - OD - Right Eye  Result Date: 11/06/2019 Tear locations include superior. Time Out Confirmed correct patient, procedure, site, and patient consented. Anesthesia Topical anesthesia  was used. Anesthetic medications included Proparacaine 0.5%. Laser Information The type of laser was diode. Color was yellow. The duration in seconds was 0.02. The spot size was 390 microns. Laser power was 300. Total spots was 175. Post-op The patient tolerated the procedure well. There were no complications. The patient received written and verbal post procedure care education. Notes Letter planned via photo was applied to the 12:00 tear however the plan would not stay in place thus a grid pattern was applied 360 around the retinal break with good visualization during the laser.  This this was confirmed by color cardiotoxicity.  Color Fundus Photography Optos - OU - Both Eyes  Result Date: 11/06/2019 Right Eye Progression has no prior data. Disc findings include normal observations. Left Eye Progression has no prior data. Disc findings include normal observations. Macula : normal observations. Vessels : normal observations. Periphery : normal observations. Notes OD, normal optic nerve, retinal vessels and macula.  Vitreous membranes and strands and debris are notable accompanying his current symptoms.  Retinal tear not visualized because of the far periphery superiorly on clinical examination. OS, old posterior vitreous detachment is notable.  Normal nerve macula vessels visible periphery   Pertinent labs & imaging results that were available during my care of the patient were reviewed by me and considered in my medical decision making (see chart for details).  Medications Ordered in ED Medications  fluorescein ophthalmic strip 1 strip (1 strip Right Eye Given by Other 11/06/19 0402)                                                                                                                                    Procedures Ultrasound ED Ocular  Date/Time: 11/07/2019 6:47 AM Performed by: Fatima Blank, MD Authorized by: Fatima Blank, MD   PROCEDURE DETAILS:    Indications: visual  change     Assessed:  Left eye and right eye   Left eye axial view: obtained     Left eye saggital view: obtained     Right eye axial view: obtained     Right eye sagittal view: obtained     Images: archived   RIGHT EYE FINDINGS:     no foreign  body noted in right eye    right eye lens not dislodged    no evidence of retinal detachment of the right eye    no vitreous hemorrhage in right eye LEFT EYE FINDINGS:     no foreign body noted in left eye    left eye lens not dislodged    no evidence of retinal detachment of the left eye    no vitreous hemorrhage in left eye    (including critical care time)  Medical Decision Making / ED Course I have reviewed the nursing notes for this encounter and the patient's prior records (if available in EHR or on provided paperwork).   JAMAURY CUDDIHY was evaluated in Emergency Department on 11/07/2019 for the symptoms described in the history of present illness. He was evaluated in the context of the global COVID-19 pandemic, which necessitated consideration that the patient might be at risk for infection with the SARS-CoV-2 virus that causes COVID-19. Institutional protocols and algorithms that pertain to the evaluation of patients at risk for COVID-19 are in a state of rapid change based on information released by regulatory bodies including the CDC and federal and state organizations. These policies and algorithms were followed during the patient's care in the ED.  Sudden right eye floaters and blurry vision. Possible retinal vs vitreous tear/detachment No pain. Doubt Closed angle glaucoma.  No corneal ulcers or abrasions. [place] w/o obvious flap.  No focal deficits concerning for CVA.  Recommend close Ophtho follow up.       Final Clinical Impression(s) / ED Diagnoses Final diagnoses:  Blurry vision  Floaters, right   The patient appears reasonably screened and/or stabilized for discharge and I doubt any other medical condition or other  [facility] requiring further screening, evaluation, or treatment in the ED at this time prior to discharge. Safe for discharge with strict return precautions.  Disposition: Discharge  Condition: Good  I have discussed the results, Dx and Tx plan with the patient/family who expressed understanding and agree(s) with the plan. Discharge instructions discussed at length. The patient/family was given strict return precautions who verbalized understanding of the instructions. No further questions at time of discharge.    ED Discharge Orders    None        Follow Up: Ophthalmology  Call today For close follow up to assess for possible retinal detachment      This chart was dictated using voice recognition software.  Despite best efforts to proofread,  errors can occur which can change the documentation meaning.   Fatima Blank, MD 11/07/19 [phone]

## 2019-11-06 NOTE — Progress Notes (Signed)
11/06/2019     CHIEF COMPLAINT Patient presents for Flashes/floaters   HISTORY OF PRESENT ILLNESS: Richard Macdonald is a 74 y.o. male who presents to the clinic today for:   HPI    Flashes/floaters    In right eye.  This started 1 day ago.  Duration Intermittant.  Characterized as spots.  Since onset it is gradually improving.  Associated Symptoms Floaters.  Negative for Flashes.  Treatments tried include no treatments.  Response to treatment was mild improvement.  I, the attending physician,  performed the HPI with the patient and updated documentation appropriately.          Comments    Pt c/o gradual blurry vision. Pt states yesterday he suddenly saw multiple floaters in OD vision. Pt states they are slowly going away but he still sees a lot of them and vision is blurry. Pt was seen by Dr. Gershon Crane about 2 weeks ago for yearly exam       Last edited by Tilda Franco on 11/06/2019  1:15 PM. (History)      Referring physician: Rutherford Guys, [place],  [place] [id number]  HISTORICAL INFORMATION:   Selected notes from the MEDICAL RECORD NUMBER    Lab Results  Component Value Date   HGBA1C 5.5 11/05/2014     CURRENT MEDICATIONS: No current outpatient medications on file. (Ophthalmic Drugs)   No current facility-administered medications for this visit. (Ophthalmic Drugs)   Current Outpatient Medications (Other)  Medication Sig  . aspirin 81 MG tablet Take 81 mg by mouth daily.    No current facility-administered medications for this visit. (Other)      REVIEW OF SYSTEMS:    ALLERGIES No Known Allergies  PAST MEDICAL HISTORY Past Medical History:  Diagnosis Date  . Allergy   . 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 Hepatitis A 1968  . TIA (transient ischemic attack) 12/2012   July.  Right thumb numbness and dysarthria.  Full w/u (EKG, echo, carotids, MRI brain) unrevealing; at the time was on no ASA, was put on 1/4 ASA qd  after and has been fine.  . Torn meniscus    R knee.  Dr. Oneal Grout.   Past Surgical History:  Procedure Laterality Date  . 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 COLONOSCOPY  2007; 02/2016   Normal 2007.  2017 repeat showed left colon diverticulosis and internal hemorrhoids, otherwise normal.  No repeat recommended due to pt age.  Marland Kitchen HEMORRHOID SURGERY  1999    FAMILY HISTORY Family History  Problem Relation Age of Onset  . Prostate cancer Father   . Breast cancer Sister   . Colon cancer Neg Hx   . Colon polyps Neg Hx   . Esophageal cancer Neg Hx   . Rectal cancer Neg Hx   . Stomach cancer Neg Hx     SOCIAL HISTORY Social History   Tobacco Use  . Smoking status: Former Smoker    Quit date: 07/04/1971    Years since quitting: 48.3  . Smokeless tobacco: Never Used  Substance Use Topics  . Alcohol use: Yes    Alcohol/week: 4.0 standard drinks    Types: 4 Glasses of wine per week    Comment: 3-4 days a week  . Drug use: No         OPHTHALMIC EXAM:  Base Eye Exam    Visual Acuity (Snellen - Linear)  Right Left   Dist cc 20/30 -1 20/25   Correction: Glasses       Tonometry (Tonopen, 1:10 PM)      Right Left   Pressure 17 19       Pupils      Dark Light Shape React APD   Right 4 3 Round Brisk None   Left 4 3 Round Brisk None       Visual Fields (Counting fingers)      Left Right    Full Full       Neuro/Psych    Oriented x3: Yes   Mood/Affect: Normal       Dilation    Both eyes: 1.0% Mydriacyl, 2.5% Phenylephrine @ 1:10 PM        Slit Lamp and Fundus Exam    External Exam      Right Left   External Normal Normal       Slit Lamp Exam      Right Left   Lids/Lashes Normal Normal   Conjunctiva/Sclera White and quiet White and quiet   Cornea Clear Clear   Anterior Chamber Deep and quiet Deep and quiet   Iris Round and reactive Round and reactive   Lens 2+ Nuclear sclerosis 2+ Nuclear sclerosis   Anterior Vitreous Normal Normal         Fundus Exam      Right Left   Posterior Vitreous  Posterior vitreous detachment   Disc  Peripapillary atrophy   C/D Ratio 0.6 0.6   Macula Normal Normal   Vessels Normal Normal   Periphery Retinal tear at 11:30, pigment at 7, poor detail view, pigment at 8, Horseshoe tear Retinal tear at 12 o clock, pigment at 6          IMAGING AND PROCEDURES  Imaging and Procedures for 11/06/19  Color Fundus Photography Optos - OU - Both Eyes       Right Eye Progression has no prior data. Disc findings include normal observations.   Left Eye Progression has no prior data. Disc findings include normal observations. Macula : normal observations. Vessels : normal observations. Periphery : normal observations.   Notes OD, normal optic nerve, retinal vessels and macula.  Vitreous membranes and strands and debris are notable accompanying his current symptoms.  Retinal tear not visualized because of the far periphery superiorly on clinical examination.  OS, old posterior vitreous detachment is notable.  Normal nerve macula vessels visible periphery       B-Scan Ultrasound - OD - Right Eye       Quality was good. Findings included vitreous opacities.   Notes Retinal tear at 12:00       Repair Retinal Breaks, Laser - OD - Right Eye       Tear locations include superior.   Time Out Confirmed correct patient, procedure, site, and patient consented.   Anesthesia Topical anesthesia was used. Anesthetic medications included Proparacaine 0.5%.   Laser Information The type of laser was diode. Color was yellow. The duration in seconds was 0.02. The spot size was 390 microns. Laser power was 300. Total spots was 175.   Post-op The patient tolerated the procedure well. There were no complications. The patient received written and verbal post procedure care education.   Notes Letter planned via photo was applied to the 12:00 tear however the plan would not stay in place thus a  grid pattern was applied 360 around the retinal break with good visualization during the laser.  This this was confirmed by color cardiotoxicity.                ASSESSMENT/PLAN:  No problem-specific Assessment & Plan notes found for this encounter.      ICD-10-CM   1. Horseshoe tear of retina of right eye without detachment  H33.311 Color Fundus Photography Optos - OU - Both Eyes    B-Scan Ultrasound - OD - Right Eye    Repair Retinal Breaks, Laser - OD - Right Eye  2. Horseshoe retinal tear of left eye  H33.312 Color Fundus Photography Optos - OU - Both Eyes  3. Posterior vitreous detachment of both eyes  H43.813   4. Vitreous floaters of both eyes  H43.393   5. Nuclear sclerotic cataract of both eyes  H25.13   6. Vitreous membranes and strands of right eye  H43.311 Color Fundus Photography Optos - OU - Both Eyes    1.  OD laser retinopexy was applied to the horseshoe retinal tear at 12:00.  No other retinal tears were discovered on clinical examination or B-scan ultrasonography  2.  Clinical retinal tear asymptomatic found in the left eye.  Appear to have some nature to it as there is some pigment in this area.  3.  Return next week for laser retinopexy left eye confirmatory B-scan ultrasonography left eye  Ophthalmic Meds Ordered this visit:  No orders of the defined types were placed in this encounter.      Return in about 1 week (around 11/13/2019), or Laser retinopexy right eye needed urgently today, for RETINOPEXY, OD, OS.  There are no Patient Instructions on file for this visit.   Explained the diagnoses, plan, and follow up with the patient and they expressed understanding.  Patient expressed understanding of the importance of proper follow up care.   Clent Demark Andrey Hoobler M.D. Diseases & Surgery of the Retina and Vitreous Retina & Diabetic [place] 11/06/19     Abbreviations: M myopia (nearsighted); A astigmatism; H hyperopia (farsighted); P presbyopia; Mrx  spectacle prescription;  CTL contact lenses; OD right eye; OS left eye; OU both eyes  XT exotropia; ET esotropia; PEK punctate epithelial keratitis; PEE punctate epithelial erosions; DES dry eye syndrome; MGD meibomian gland dysfunction; ATs artificial tears; PFAT's preservative free artificial tears; Barren nuclear sclerotic cataract; PSC posterior subcapsular cataract; ERM epi-retinal membrane; PVD posterior vitreous detachment; RD retinal detachment; DM diabetes mellitus; DR diabetic retinopathy; NPDR non-proliferative diabetic retinopathy; PDR proliferative diabetic retinopathy; CSME clinically significant macular edema; DME diabetic macular edema; dbh dot blot hemorrhages; CWS cotton wool spot; POAG primary open angle glaucoma; C/D cup-to-disc ratio; HVF humphrey visual field; GVF goldmann visual field; OCT optical coherence tomography; IOP intraocular pressure; BRVO Branch retinal vein occlusion; CRVO central retinal vein occlusion; CRAO central retinal artery occlusion; BRAO branch retinal artery occlusion; RT retinal tear; SB scleral buckle; PPV pars plana vitrectomy; VH Vitreous hemorrhage; PRP panretinal laser photocoagulation; IVK intravitreal kenalog; VMT vitreomacular traction; MH Macular hole;  NVD neovascularization of the disc; NVE neovascularization elsewhere; AREDS age related eye disease study; ARMD age related macular degeneration; POAG primary open angle glaucoma; EBMD epithelial/anterior basement membrane dystrophy; ACIOL anterior chamber intraocular lens; IOL intraocular lens; PCIOL posterior chamber intraocular lens; Phaco/IOL phacoemulsification with intraocular lens placement; Draper photorefractive keratectomy; LASIK laser assisted in situ keratomileusis; HTN hypertension; DM diabetes mellitus; COPD chronic obstructive pulmonary disease

## 2019-11-06 NOTE — ED Notes (Signed)
Pt is independently ambulatory to the bathroom.

## 2019-11-06 NOTE — ED Notes (Signed)
Patient verbalizes understanding of discharge instructions. Opportunity for questioning and answers were provided. Armband removed by staff, pt discharged from ED.  

## 2019-11-10 ENCOUNTER — Ambulatory Visit (INDEPENDENT_AMBULATORY_CARE_PROVIDER_SITE_OTHER): Payer: Medicare Other | Admitting: Ophthalmology

## 2019-11-10 ENCOUNTER — Encounter (INDEPENDENT_AMBULATORY_CARE_PROVIDER_SITE_OTHER): Payer: Self-pay | Admitting: Ophthalmology

## 2019-11-10 ENCOUNTER — Other Ambulatory Visit: Payer: Self-pay

## 2019-11-10 DIAGNOSIS — H33312 Horseshoe tear of retina without detachment, left eye: Secondary | ICD-10-CM

## 2019-12-22 ENCOUNTER — Encounter (INDEPENDENT_AMBULATORY_CARE_PROVIDER_SITE_OTHER): Payer: Medicare Other | Admitting: Ophthalmology

## 2019-12-29 ENCOUNTER — Ambulatory Visit (INDEPENDENT_AMBULATORY_CARE_PROVIDER_SITE_OTHER): Payer: Medicare Other | Admitting: Ophthalmology

## 2019-12-29 ENCOUNTER — Other Ambulatory Visit: Payer: Self-pay

## 2019-12-29 ENCOUNTER — Encounter (INDEPENDENT_AMBULATORY_CARE_PROVIDER_SITE_OTHER): Payer: Self-pay | Admitting: Ophthalmology

## 2019-12-29 DIAGNOSIS — H33312 Horseshoe tear of retina without detachment, left eye: Secondary | ICD-10-CM | POA: Insufficient documentation

## 2019-12-29 DIAGNOSIS — H33321 Round hole, right eye: Secondary | ICD-10-CM

## 2019-12-29 DIAGNOSIS — H33311 Horseshoe tear of retina without detachment, right eye: Secondary | ICD-10-CM

## 2019-12-29 NOTE — Patient Instructions (Signed)
Patient to contact the office should new flashes of light floaters or darkness descend upon the visual field of either eye

## 2019-12-29 NOTE — Progress Notes (Signed)
12/29/2019     CHIEF COMPLAINT Patient presents for Retina Follow Up   HISTORY OF PRESENT ILLNESS: Richard Macdonald is a 74 y.o. male who presents to the clinic today for:   HPI    Retina Follow Up    Patient presents with  Other.  In both eyes.  Duration of 7 weeks.  Since onset it is stable.          Comments    7 week follow up - FP OU Patient denies change in vision and overall has no complaints.         Last edited by Gerda Diss on 12/29/2019  1:39 PM. (History)      Referring physician: Tammi Sou, MD [address] [place],  [place] [address]  HISTORICAL INFORMATION:   Selected notes from the MEDICAL RECORD NUMBER    Lab Results  Component Value Date   HGBA1C 5.5 11/05/2014     CURRENT MEDICATIONS: No current outpatient medications on file. (Ophthalmic Drugs)   No current facility-administered medications for this visit. (Ophthalmic Drugs)   Current Outpatient Medications (Other)  Medication Sig  . aspirin 81 MG tablet Take 81 mg by mouth daily.    No current facility-administered medications for this visit. (Other)      REVIEW OF SYSTEMS: ROS    Negative for: Eyes   Last edited by Hurman Horn, MD on 12/29/2019  2:23 PM. (History)       ALLERGIES No Known Allergies  PAST MEDICAL HISTORY Past Medical History:  Diagnosis Date  . Allergy   . 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 Hepatitis A 1968  . TIA (transient ischemic attack) 12/2012   July.  Right thumb numbness and dysarthria.  Full w/u (EKG, echo, carotids, MRI brain) unrevealing; at the time was on no ASA, was put on 1/4 ASA qd after and has been fine.  . Torn meniscus    R knee.  Dr. Oneal Grout.   Past Surgical History:  Procedure Laterality Date  . 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 COLONOSCOPY  2007; 02/2016   Normal 2007.  2017 repeat showed left colon diverticulosis and internal hemorrhoids, otherwise normal.  No repeat recommended  due to pt age.  Marland Kitchen HEMORRHOID SURGERY  1999    FAMILY HISTORY Family History  Problem Relation Age of Onset  . Prostate cancer Father   . Breast cancer Sister   . Colon cancer Neg Hx   . Colon polyps Neg Hx   . Esophageal cancer Neg Hx   . Rectal cancer Neg Hx   . Stomach cancer Neg Hx     SOCIAL HISTORY Social History   Tobacco Use  . Smoking status: Former Smoker    Quit date: 07/04/1971    Years since quitting: 48.5  . Smokeless tobacco: Never Used  Vaping Use  . Vaping Use: Never used  Substance Use Topics  . Alcohol use: Yes    Alcohol/week: 4.0 standard drinks    Types: 4 Glasses of wine per week    Comment: 3-4 days a week  . Drug use: No         OPHTHALMIC EXAM:  Base Eye Exam    Visual Acuity (Snellen - Linear)      Right Left   Dist cc 20/20-2 20/20-2       Tonometry (Tonopen, 1:42 PM)      Right  Left   Pressure 15 16       Pupils      Pupils Dark Light Shape React APD   Right PERRL 4 3 Round Slow None   Left PERRL 4 3 Round Slow None       Visual Fields (Counting fingers)      Left Right    Full Full       Extraocular Movement      Right Left    Full Full       Neuro/Psych    Oriented x3: Yes   Mood/Affect: Normal       Dilation    Both eyes: 1.0% Mydriacyl, 2.5% Phenylephrine @ 1:42 PM        Slit Lamp and Fundus Exam    External Exam      Right Left   External Normal Normal       Slit Lamp Exam      Right Left   Lids/Lashes Normal Normal   Conjunctiva/Sclera White and quiet White and quiet   Cornea Clear Clear   Anterior Chamber Deep and quiet Deep and quiet   Iris Round and reactive Round and reactive   Lens 2+ Nuclear sclerosis 2+ Nuclear sclerosis   Anterior Vitreous Normal Normal       Fundus Exam      Right Left   Posterior Vitreous  Posterior vitreous detachment   Disc  Peripapillary atrophy   C/D Ratio 0.6 0.6   Macula Normal Normal   Vessels Normal Normal   Periphery Retinal tear at 11:30  Good  pexy,, , pigment at 7, poor detail view, pigment at 8,  new  Retinal Tear at 1:00 Retinal tear at 12 o clock, pigment at 6          IMAGING AND PROCEDURES  Imaging and Procedures for 12/29/19  Color Fundus Photography Optos - OU - Both Eyes       Right Eye Progression has been stable. Disc findings include normal observations. Macula : normal observations. Vessels : normal observations. Periphery : normal observations.   Left Eye Progression has been stable. Disc findings include normal observations. Macula : normal observations. Vessels : normal observations. Periphery : normal observations.   Notes OD, retinal break at 11:00 with good retinopexy however not well visualized on color fundus photography today.  Similarly new retinal break at 12:30 position to 1  OS area of retinal tear with holes superiorly at 12:00 with good laser retinopexy       Repair Retinal Breaks, Laser - OD - Right Eye       Tear locations include superior.   Time Out Confirmed correct patient, procedure, site, and patient consented.   Anesthesia Topical anesthesia was used. Anesthetic medications included Proparacaine 0.5%.   Laser Information The type of laser was diode. Color was yellow. The duration in seconds was 0.04. The spot size was 390 microns. Laser power was 240. Total spots was 196.   Post-op The patient tolerated the procedure well. There were no complications. The patient received written and verbal post procedure care education.                 ASSESSMENT/PLAN:  No problem-specific Assessment & Plan notes found for this encounter.      ICD-10-CM   1. Horseshoe retinal tear of left eye  H33.312 Color Fundus Photography Optos - OU - Both Eyes  2. Horseshoe tear of retina of right eye without detachment  H33.311 Color  Fundus Photography Optos - OU - Both Eyes  3. Round hole of right retina without detachment  H33.321 Repair Retinal Breaks, Laser - OD - Right Eye    1.   Laser retinopexy to round hole of the right retina at the 1 o'clock position,, found separate from the previous retinal tear at 11:00  2.  New laser retinopexy applied today, no complications  3.  Ophthalmic Meds Ordered this visit:  No orders of the defined types were placed in this encounter.      Return in 6 weeks (on 02/09/2020) for new, dilate, OD.  Patient Instructions  Patient to contact the office should new flashes of light floaters or darkness descend upon the visual field of either eye    Explained the diagnoses, plan, and follow up with the patient and they expressed understanding.  Patient expressed understanding of the importance of proper follow up care.   Clent Demark Aleatha Taite M.D. Diseases & Surgery of the Retina and Vitreous Retina & Diabetic [place] 12/29/19     Abbreviations: M myopia (nearsighted); A astigmatism; H hyperopia (farsighted); P presbyopia; Mrx spectacle prescription;  CTL contact lenses; OD right eye; OS left eye; OU both eyes  XT exotropia; ET esotropia; PEK punctate epithelial keratitis; PEE punctate epithelial erosions; DES dry eye syndrome; MGD meibomian gland dysfunction; ATs artificial tears; PFAT's preservative free artificial tears; [place] nuclear sclerotic cataract; PSC posterior subcapsular cataract; ERM epi-retinal membrane; PVD posterior vitreous detachment; RD retinal detachment; DM diabetes mellitus; DR diabetic retinopathy; NPDR non-proliferative diabetic retinopathy; PDR proliferative diabetic retinopathy; CSME clinically significant macular edema; DME diabetic macular edema; dbh dot blot hemorrhages; CWS cotton wool spot; POAG primary open angle glaucoma; C/D cup-to-disc ratio; HVF humphrey visual field; GVF goldmann visual field; OCT optical coherence tomography; IOP intraocular pressure; BRVO Branch retinal vein occlusion; CRVO central retinal vein occlusion; CRAO central retinal artery occlusion; BRAO branch retinal artery occlusion; RT  retinal tear; SB scleral buckle; PPV pars plana vitrectomy; VH Vitreous hemorrhage; PRP panretinal laser photocoagulation; IVK intravitreal kenalog; VMT vitreomacular traction; MH Macular hole;  NVD neovascularization of the disc; NVE neovascularization elsewhere; AREDS age related eye disease study; ARMD age related macular degeneration; POAG primary open angle glaucoma; EBMD epithelial/anterior basement membrane dystrophy; ACIOL anterior chamber intraocular lens; IOL intraocular lens; PCIOL posterior chamber intraocular lens; Phaco/IOL phacoemulsification with intraocular lens placement; [place] photorefractive keratectomy; LASIK laser assisted in situ keratomileusis; HTN hypertension; DM diabetes mellitus; COPD chronic obstructive pulmonary disease

## 2020-01-01 ENCOUNTER — Telehealth (INDEPENDENT_AMBULATORY_CARE_PROVIDER_SITE_OTHER): Payer: Medicare Other | Admitting: Family Medicine

## 2020-01-01 ENCOUNTER — Encounter: Payer: Self-pay | Admitting: Family Medicine

## 2020-01-01 DIAGNOSIS — M7021 Olecranon bursitis, right elbow: Secondary | ICD-10-CM

## 2020-01-01 NOTE — Patient Instructions (Signed)
Ice 15 minutes twice daily - wrap ice pack in towel (do not apply directly to skin).  Naproxen 250-500mg  1-2 times daily (every 12 hours) for 3 days then as needed per instructions.  Protect this area from bump and from resting on hard surfaces and avoid strenuous upper body activity until feeling better.  I hope you are feeling better soon! Seek care promptly if your symptoms worsen, new concerns arise or you are not improving with treatment.

## 2020-01-01 NOTE — Progress Notes (Signed)
Virtual Visit via Video Note  I connected with Richard Macdonald  on 01/01/20 at  5:00 PM EDT by a video enabled telemedicine application and verified that I am speaking with the correct person using two identifiers.  Location patient: home, [place] Location provider:work or home office Persons participating in the virtual visit: patient, provider  I discussed the limitations of evaluation and management by telemedicine and the availability of in person appointments. The patient expressed understanding and agreed to proceed.   HPI:  Acute visit for a bump on his elbow: -started yesterday -soft swelling over the R elbow -he does not recall injuring this -goes to the gym a few times per week and does resistant training -he does lean his elbow on things quite a bit -no pain, redness, warmth, decreased ROM, fevers, malaise   ROS: See pertinent positives and negatives per HPI.  Past Medical History:  Diagnosis Date  . Allergy   . 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 Hepatitis A 1968  . TIA (transient ischemic attack) 12/2012   July.  Right thumb numbness and dysarthria.  Full w/u (EKG, echo, carotids, MRI brain) unrevealing; at the time was on no ASA, was put on 1/4 ASA qd after and has been fine.  . Torn meniscus    R knee.  Dr. Oneal Grout.    Past Surgical History:  Procedure Laterality Date  . 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 COLONOSCOPY  2007; 02/2016   Normal 2007.  2017 repeat showed left colon diverticulosis and internal hemorrhoids, otherwise normal.  No repeat recommended due to pt age.  Marland Kitchen HEMORRHOID SURGERY  1999    Family History  Problem Relation Age of Onset  . Prostate cancer Father   . Breast cancer Sister   . Colon cancer Neg Hx   . Colon polyps Neg Hx   . Esophageal cancer Neg Hx   . Rectal cancer Neg Hx   . Stomach cancer Neg Hx     SOCIAL HX: see hpi   Current Outpatient Medications:  .  aspirin 81 MG tablet, Take 81 mg by mouth daily.  , Disp: , Rfl:   EXAM:  VITALS per patient if applicable:  GENERAL: alert, oriented, appears well and in no acute distress  HEENT: atraumatic, conjunttiva clear, no obvious abnormalities on inspection of external nose and ears  NECK: normal movements of the head and neck  LUNGS: on inspection no signs of respiratory distress, breathing rate appears normal, no obvious gross SOB, gasping or wheezing  CV: no obvious cyanosis  MS: moves all visible extremities without noticeable abnormality, limited video visit exam reveal bump over the R elbow - pt reports it is soft and NTTP, full ROM on pt self exam, no visible redness and he denies warmth on self exam  PSYCH/NEURO: pleasant and cooperative, no obvious depression or anxiety, speech and thought processing grossly intact  ASSESSMENT AND PLAN:  Discussed the following assessment and plan:  No diagnosis found.  -we discussed possible serious and likely etiologies, options for evaluation and workup, limitations of telemedicine visit vs in person visit, treatment, treatment risks and precautions. Pt prefers to treat via telemedicine empirically rather then risking or undertaking an in person visit at this moment. Suspect olecranon bursitis. Discussed various causes, symptoms, signs, workup and treatment. Doubt infection or septic bursitis given hx and exam. He prefers to try treatment with ice, naproxen and protection of the area to start  with.  Patient agrees to seek prompt in person care if worsening, new symptoms arise, or if is not improving promptly with treatment above.   I discussed the assessment and treatment plan with the patient. The patient was provided an opportunity to ask questions and all were answered. The patient agreed with the plan and demonstrated an understanding of the instructions.   The patient was advised to call back or seek an in-person evaluation if the symptoms worsen or if the condition fails to improve as  anticipated.   Lucretia Kern, DO

## 2020-01-14 ENCOUNTER — Other Ambulatory Visit: Payer: Self-pay

## 2020-01-14 ENCOUNTER — Ambulatory Visit (INDEPENDENT_AMBULATORY_CARE_PROVIDER_SITE_OTHER): Payer: Medicare Other | Admitting: Family Medicine

## 2020-01-14 VITALS — BP 127/78 | HR 64 | Temp 98.4°F | Resp 16 | Ht 70.0 in | Wt 179.2 lb

## 2020-01-14 DIAGNOSIS — M7021 Olecranon bursitis, right elbow: Secondary | ICD-10-CM

## 2020-01-14 NOTE — Progress Notes (Signed)
OFFICE VISIT  01/14/2020   CC:  Chief Complaint  Patient presents with   Bump on right elbow    had virtual visit on 01/01/20 regarding this but still no improvement     HPI:    Patient is a 74 y.o.  male who presents for f/u R elbow problem. Saw Dr. Maudie Mercury 01/01/20 for 1 d hx of R elbow pain/swelling, dx'd with olecranon bursitis. Ice + naproxen was advised.  INTERIM HX: No change, has abrasion about dime sized on tip of olecranon---says he got too aggressive with icing it. No pain or fevers. No hx of this before. He asks to have this drained today.   Past Medical History:  Diagnosis Date   Allergy    Deafness in right ear    Hearing aids since 2016   Hepatitis A 1968   TIA (transient ischemic attack) 12/2012   July.  Right thumb numbness and dysarthria.  Full w/u (EKG, echo, carotids, MRI brain) unrevealing; at the time was on no ASA, was put on 1/4 ASA qd after and has been fine.   Torn meniscus    R knee.  Dr. Oneal Grout.    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  2017 repeat showed left colon diverticulosis and internal hemorrhoids, otherwise normal.  No repeat recommended due to pt age.   [place]    Outpatient Medications Prior to Visit  Medication Sig Dispense Refill   aspirin 81 MG tablet Take 81 mg by mouth daily.      No facility-administered medications prior to visit.    No Known Allergies  ROS As per HPI  PE: Vitals with BMI 01/14/2020 11/06/2019 11/06/2019  Height 5\' 10"  - -  Weight 179 lbs 3 oz - -  BMI 44.81 - -  Systolic 856 314 970  Diastolic 78 77 75  Pulse 64 67 62  O2 sat on RA today is 97%  Gen: Alert, well appearing.  Patient is oriented to person, place, time, and situation. AFFECT: pleasant, lucid thought and speech. R olecranon with golf ball sized soft, fluctuant swelling w/out erythema. Dime sized superficial  abrasion w/out surrounding erythema is present at the tip of this.  Non-tender.  Elbow ROM fully intact.  LABS:    Chemistry      Component Value Date/Time   NA 139 11/05/2019 2155   K 3.9 11/05/2019 2155   CL 101 11/05/2019 2155   CO2 27 11/05/2019 2119   BUN 21 11/05/2019 2155   CREATININE 0.90 11/05/2019 2155   CREATININE 1.14 11/17/2015 0952      Component Value Date/Time   CALCIUM 9.2 11/05/2019 2119   ALKPHOS 63 11/05/2019 2119   AST 17 11/05/2019 2119   ALT 16 11/05/2019 2119   BILITOT 0.9 11/05/2019 2119     Lab Results  Component Value Date   WBC 7.1 11/05/2019   HGB 14.3 11/05/2019   HCT 42.0 11/05/2019   MCV 92.2 11/05/2019   PLT 181 11/05/2019   Lab Results  Component Value Date   HGBA1C 5.5 11/05/2014    IMPRESSION AND PLAN:  R olecranon bursitis. No improvement with conservative mgmt. No sign of infection or alternative dx. Pt desires aspiration. Consent obtained. Used sterile technique, 18 gauge needle used to aspirate Return of 15 ml of thin yellow fluid with bloody tint was noted.  NO  pus. Pt tolerated procedure well, no bleeding. No complications.  Mildly edematous soft tissue remained present over olecranon but o/w all fluid drained from bursa on this procedure today.  Compression wrap + icing instructions discussed today. Signs/symptoms to call or return for were reviewed and pt expressed understanding.  An After Visit Summary was printed and given to the patient.  FOLLOW UP: Return if symptoms worsen or fail to improve.  Signed:  Crissie Sickles, MD           01/14/2020

## 2020-02-09 ENCOUNTER — Encounter (INDEPENDENT_AMBULATORY_CARE_PROVIDER_SITE_OTHER): Payer: Self-pay | Admitting: Ophthalmology

## 2020-02-09 ENCOUNTER — Other Ambulatory Visit: Payer: Self-pay

## 2020-02-09 ENCOUNTER — Ambulatory Visit (INDEPENDENT_AMBULATORY_CARE_PROVIDER_SITE_OTHER): Payer: Medicare Other | Admitting: Ophthalmology

## 2020-02-09 DIAGNOSIS — H33311 Horseshoe tear of retina without detachment, right eye: Secondary | ICD-10-CM | POA: Diagnosis not present

## 2020-02-09 DIAGNOSIS — H33312 Horseshoe tear of retina without detachment, left eye: Secondary | ICD-10-CM

## 2020-02-09 NOTE — Assessment & Plan Note (Signed)
No new retinal breaks 

## 2020-02-09 NOTE — Progress Notes (Signed)
02/09/2020     CHIEF COMPLAINT Patient presents for Retina Follow Up   HISTORY OF PRESENT ILLNESS: Richard Macdonald is a 74 y.o. male who presents to the clinic today for:   HPI    Retina Follow Up    Patient presents with  Other.  In right eye.  Duration of 6 weeks.  Since onset it is stable.          Comments    6 week follow up - OCT OU Patient denies change in vision and overall has no complaints.        Last edited by Gerda Diss on 02/09/2020  2:15 PM. (History)      Referring physician: Tammi Sou, MD [address] [place],  [place] [address]  HISTORICAL INFORMATION:   Selected notes from the MEDICAL RECORD NUMBER    Lab Results  Component Value Date   HGBA1C 5.5 11/05/2014     CURRENT MEDICATIONS: No current outpatient medications on file. (Ophthalmic Drugs)   No current facility-administered medications for this visit. (Ophthalmic Drugs)   Current Outpatient Medications (Other)  Medication Sig  . aspirin 81 MG tablet Take 81 mg by mouth daily.    No current facility-administered medications for this visit. (Other)      REVIEW OF SYSTEMS:    ALLERGIES No Known Allergies  PAST MEDICAL HISTORY Past Medical History:  Diagnosis Date  . Allergy   . 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 Hepatitis A 1968  . TIA (transient ischemic attack) 12/2012   July.  Right thumb numbness and dysarthria.  Full w/u (EKG, echo, carotids, MRI brain) unrevealing; at the time was on no ASA, was put on 1/4 ASA qd after and has been fine.  . Torn meniscus    R knee.  Dr. Oneal Grout.   Past Surgical History:  Procedure Laterality Date  . 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 COLONOSCOPY  2007; 02/2016   Normal 2007.  2017 repeat showed left colon diverticulosis and internal hemorrhoids, otherwise normal.  No repeat recommended due to pt age.  Marland Kitchen HEMORRHOID SURGERY  1999    FAMILY HISTORY Family History  Problem Relation Age of  Onset  . Prostate cancer Father   . Breast cancer Sister   . Colon cancer Neg Hx   . Colon polyps Neg Hx   . Esophageal cancer Neg Hx   . Rectal cancer Neg Hx   . Stomach cancer Neg Hx     SOCIAL HISTORY Social History   Tobacco Use  . Smoking status: Former Smoker    Quit date: 07/04/1971    Years since quitting: 48.6  . Smokeless tobacco: Never Used  Vaping Use  . Vaping Use: Never used  Substance Use Topics  . Alcohol use: Yes    Alcohol/week: 4.0 standard drinks    Types: 4 Glasses of wine per week    Comment: 3-4 days a week  . Drug use: No         OPHTHALMIC EXAM: Base Eye Exam    Visual Acuity (Snellen - Linear)      Right Left   Dist cc 20/25+2 20/25-1   Correction: Glasses       Tonometry (Tonopen, 2:18 PM)      Right Left   Pressure 12 11       Pupils      Pupils Dark Light Shape React  APD   Right PERRL 4 3 Round Slow None   Left PERRL 4 3 Round Slow None       Visual Fields (Counting fingers)      Left Right    Full Full       Extraocular Movement      Right Left    Full Full       Neuro/Psych    Oriented x3: Yes   Mood/Affect: Normal       Dilation    Right eye: 1.0% Mydriacyl, 2.5% Phenylephrine @ 2:18 PM        Slit Lamp and Fundus Exam    External Exam      Right Left   External Normal Normal       Slit Lamp Exam      Right Left   Lids/Lashes Normal Normal   Conjunctiva/Sclera White and quiet White and quiet   Cornea Clear Clear   Anterior Chamber Deep and quiet Deep and quiet   Iris Round and reactive Round and reactive   Lens 2+ Nuclear sclerosis 2+ Nuclear sclerosis   Anterior Vitreous Normal Normal       Fundus Exam      Right Left   Posterior Vitreous Posterior vitreous detachment, Central vitreous floaters    Disc Normal    C/D Ratio 0.6    Macula Normal    Vessels Normal    Periphery Retinal tear at 11:30  Good pexy,, , pigment at 7, poor detail view, pigment at 8,  Retinal Tear at 1:00 good pexy, good  laser at 12           IMAGING AND PROCEDURES  Imaging and Procedures for 02/09/20  Color Fundus Photography Optos - OU - Both Eyes       Right Eye Progression has been stable. Macula : normal observations. Vessels : normal observations.   Left Eye Progression has been stable. Disc findings include normal observations. Vessels : normal observations.   Notes Posterior vitreous detachment OS, with large central vitreous floater  OD, similar posterior vitreous detachment, small vitreous floater, no new retinal breaks                ASSESSMENT/PLAN:  Horseshoe retinal tear of left eye No new breaks      ICD-10-CM   1. Horseshoe tear of retina of right eye without detachment  H33.311 Color Fundus Photography Optos - OU - Both Eyes  2. Horseshoe retinal tear of left eye  H33.312     1.  2.  3.  Ophthalmic Meds Ordered this visit:  No orders of the defined types were placed in this encounter.      Return in about 4 months (around 06/10/2020) for COLOR FP, DILATE OU.  There are no Patient Instructions on file for this visit.   Explained the diagnoses, plan, and follow up with the patient and they expressed understanding.  Patient expressed understanding of the importance of proper follow up care.   Clent Demark Enes Rokosz M.D. Diseases & Surgery of the Retina and Vitreous Retina & Diabetic [place] 02/09/20     Abbreviations: M myopia (nearsighted); A astigmatism; H hyperopia (farsighted); P presbyopia; Mrx spectacle prescription;  CTL contact lenses; OD right eye; OS left eye; OU both eyes  XT exotropia; ET esotropia; PEK punctate epithelial keratitis; PEE punctate epithelial erosions; DES dry eye syndrome; MGD meibomian gland dysfunction; ATs artificial tears; PFAT's preservative free artificial tears; [place] nuclear sclerotic cataract; PSC posterior subcapsular  cataract; ERM epi-retinal membrane; PVD posterior vitreous detachment; RD retinal detachment; DM  diabetes mellitus; DR diabetic retinopathy; NPDR non-proliferative diabetic retinopathy; PDR proliferative diabetic retinopathy; CSME clinically significant macular edema; DME diabetic macular edema; dbh dot blot hemorrhages; CWS cotton wool spot; POAG primary open angle glaucoma; C/D cup-to-disc ratio; HVF humphrey visual field; GVF goldmann visual field; OCT optical coherence tomography; IOP intraocular pressure; BRVO Branch retinal vein occlusion; CRVO central retinal vein occlusion; CRAO central retinal artery occlusion; BRAO branch retinal artery occlusion; RT retinal tear; SB scleral buckle; PPV pars plana vitrectomy; VH Vitreous hemorrhage; PRP panretinal laser photocoagulation; IVK intravitreal kenalog; VMT vitreomacular traction; MH Macular hole;  NVD neovascularization of the disc; NVE neovascularization elsewhere; AREDS age related eye disease study; ARMD age related macular degeneration; POAG primary open angle glaucoma; EBMD epithelial/anterior basement membrane dystrophy; ACIOL anterior chamber intraocular lens; IOL intraocular lens; PCIOL posterior chamber intraocular lens; Phaco/IOL phacoemulsification with intraocular lens placement; [place] photorefractive keratectomy; LASIK laser assisted in situ keratomileusis; HTN hypertension; DM diabetes mellitus; COPD chronic obstructive pulmonary disease

## 2020-02-09 NOTE — Assessment & Plan Note (Signed)
No new breaks °

## 2020-02-19 DIAGNOSIS — M25462 Effusion, left knee: Secondary | ICD-10-CM | POA: Diagnosis not present

## 2020-05-19 DIAGNOSIS — Z23 Encounter for immunization: Secondary | ICD-10-CM | POA: Diagnosis not present

## 2020-06-13 IMAGING — CT CT HEAD W/O CM
4 series · 17 of 47 positions shown, 19 images · non-contrast
Comparison: None.

CLINICAL DATA: Sudden onset blurred vision right eye

EXAM:
CT HEAD WITHOUT CONTRAST
TECHNIQUE: Contiguous axial images were obtained from the base of the skull
through the vertex without intravenous contrast.

[Series 3: head wo · axial · 0.43mm/px · z∈[-589,-469]mm · 7 of 34 slices shown, 9 images]
[im 5/34  brain]
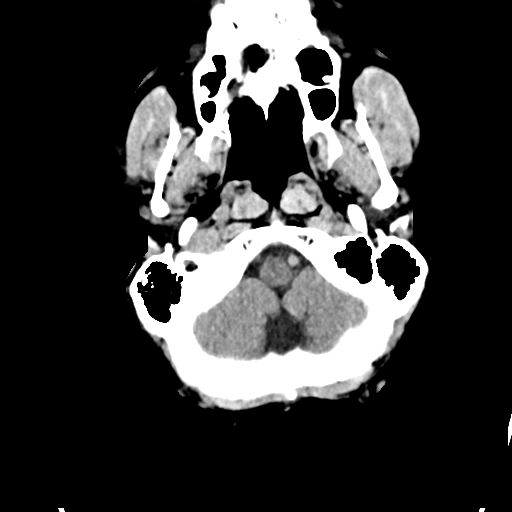
[im 5/34  bone]
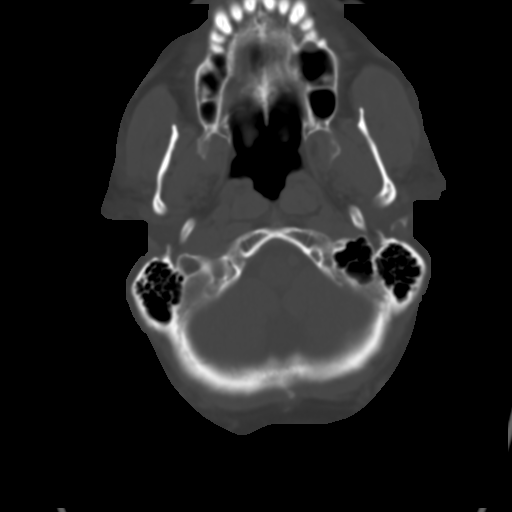
[im 9/34  brain]
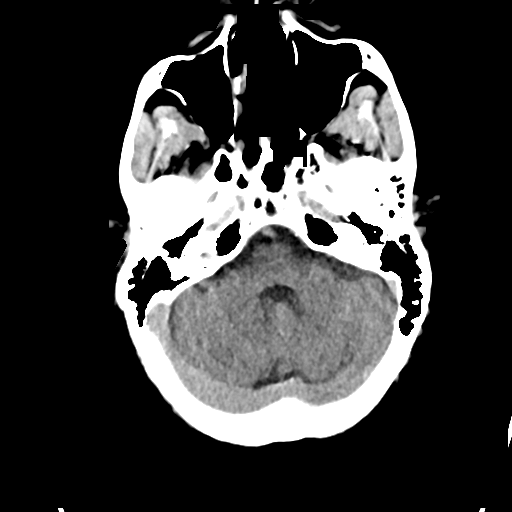
[im 13/34  brain]
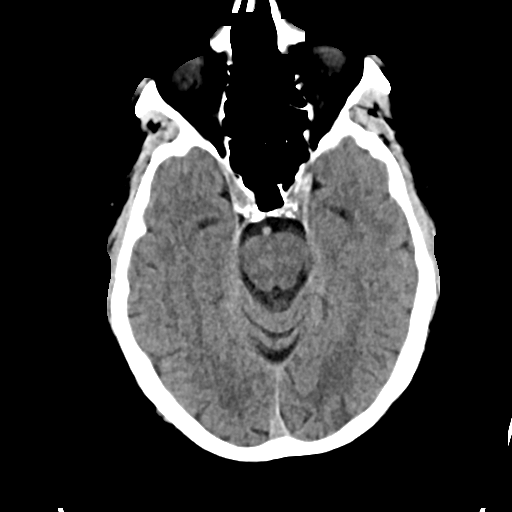
[im 17/34  brain]
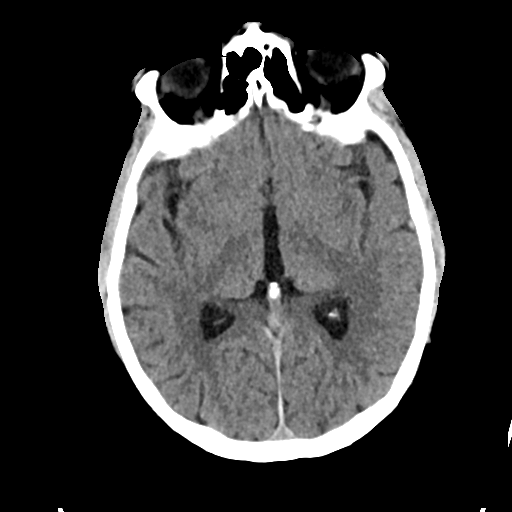
[im 21/34  brain]
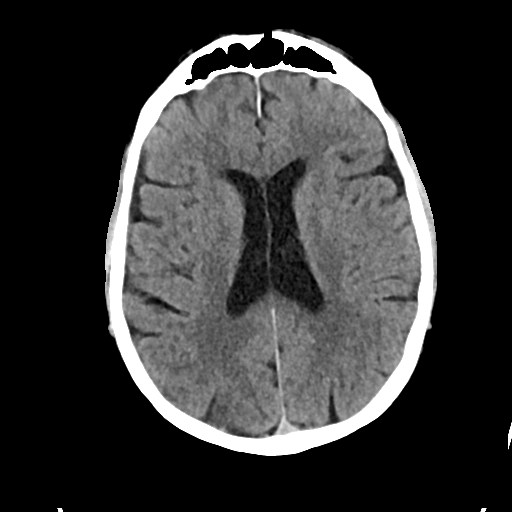
[im 21/34  bone]
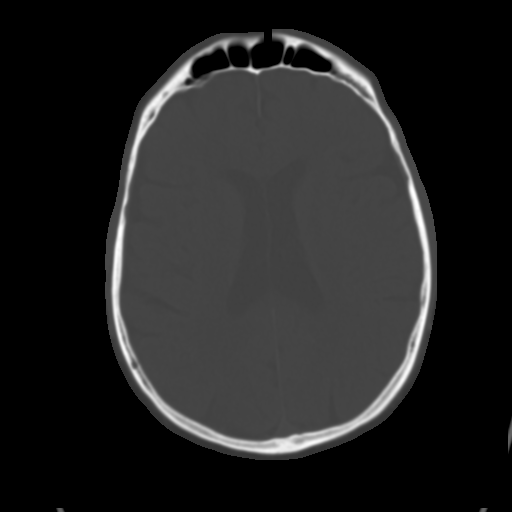
[im 25/34  brain]
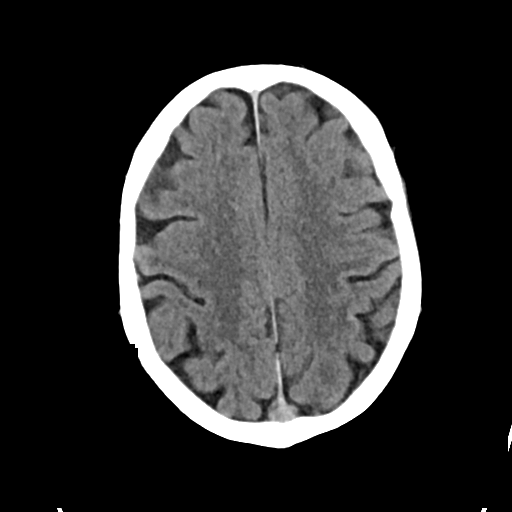
[im 29/34  brain]
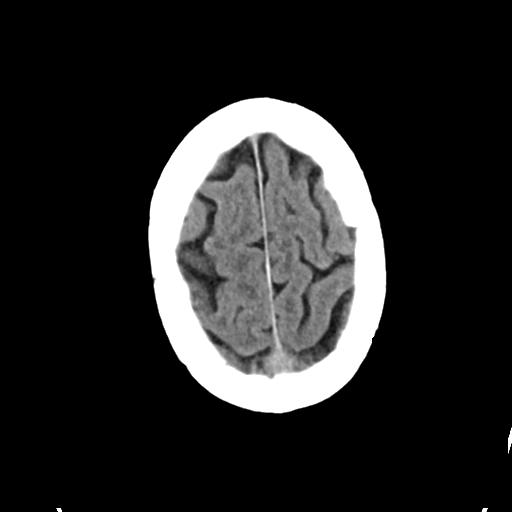

[Series 4: head bone · axial · 0.43mm/px · z∈[-593,-535]mm · 4 of 84 slices shown]
[im 9/84  bone]
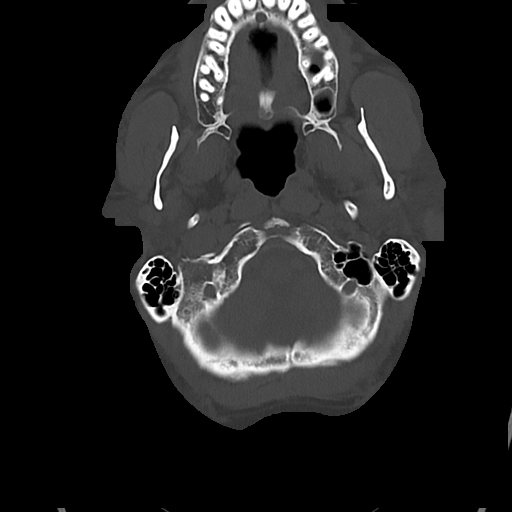
[im 17/84  bone]
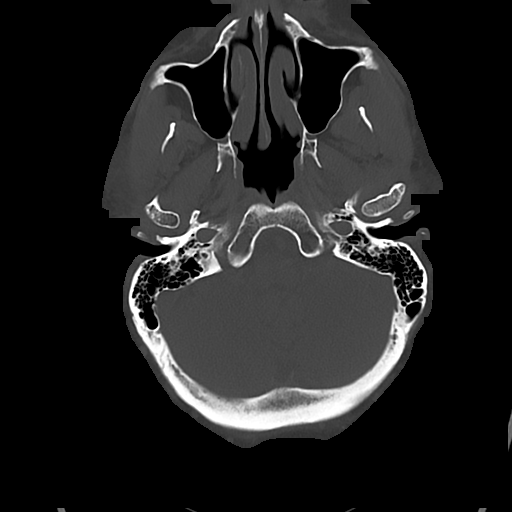
[im 25/84  bone]
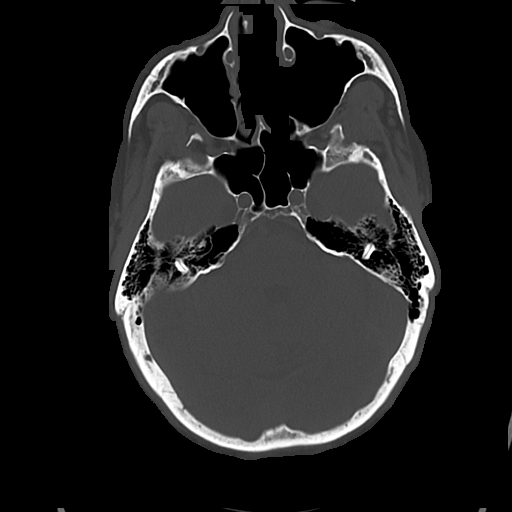
[im 38/84  bone]
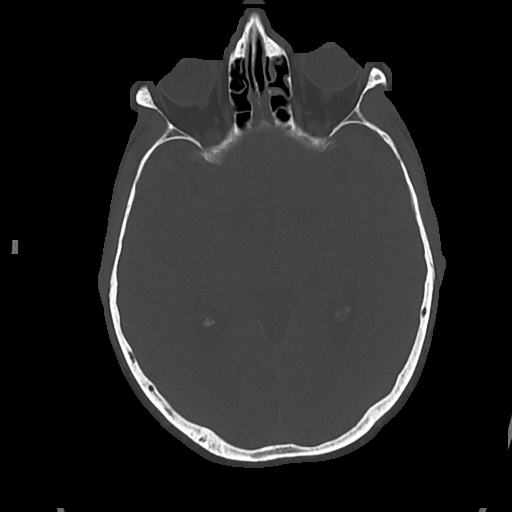

[Series 5: cor soft · coronal · 0.36mm/px · 3 of 75 slices shown]
[im 25/75  brain]
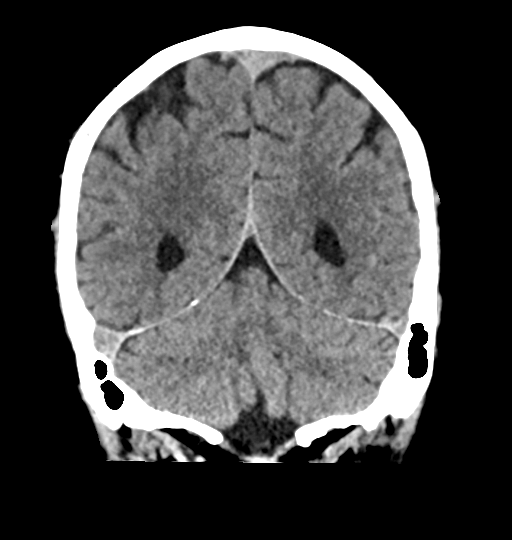
[im 33/75  brain]
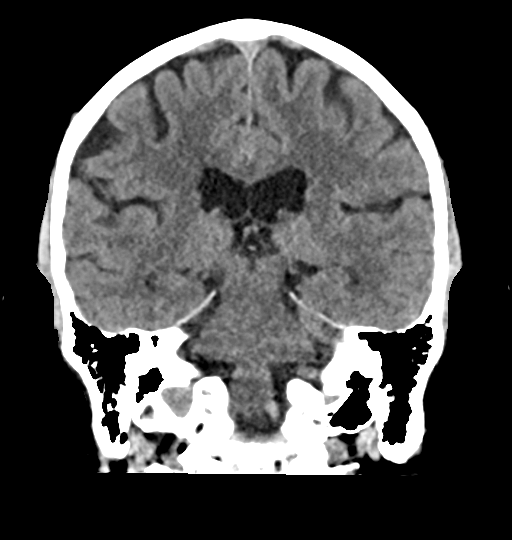
[im 42/75  brain]
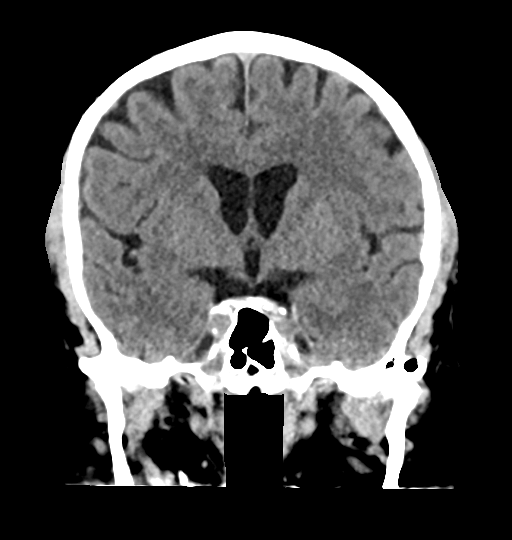

[Series 6: sag soft · sagittal · 0.37mm/px · 3 of 60 slices shown]
[im 20/60  brain]
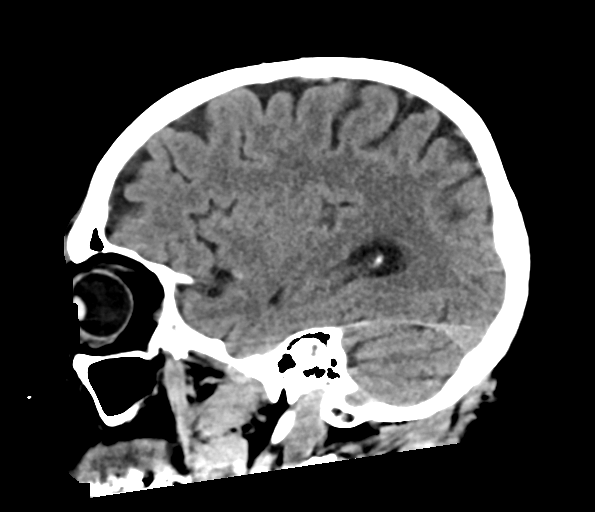
[im 30/60  brain]
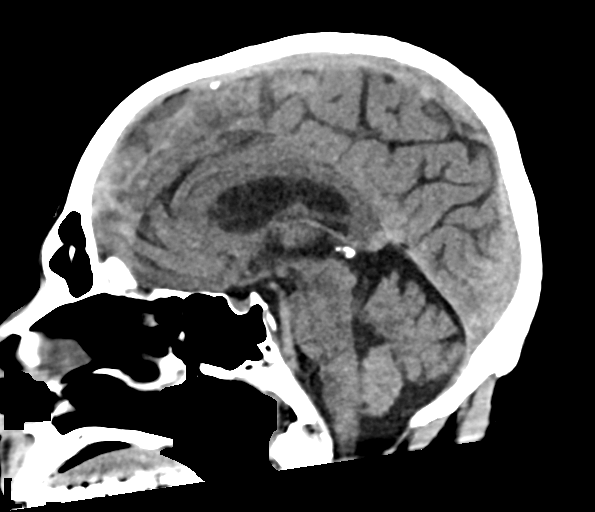
[im 40/60  brain]
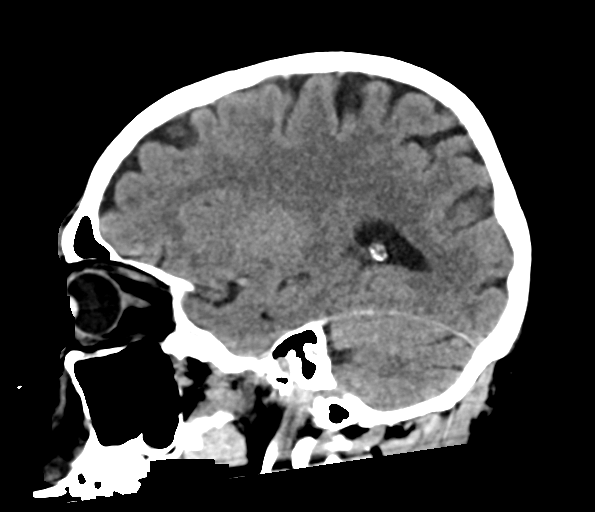

[17 of 47 positions shown; findings below may reference images not displayed]

FINDINGS: Brain: No acute infarct or hemorrhage. Lateral ventricles and
midline structures are unremarkable. No acute extra-axial fluid
collections. No mass effect.

Vascular: No hyperdense vessel or unexpected calcification.

Skull: Normal. Negative for fracture or focal lesion.

Sinuses/Orbits: No acute finding. Orbits and globes are
unremarkable.

Other: None.
IMPRESSION: 1. No acute intracranial process.

## 2020-07-12 ENCOUNTER — Encounter (INDEPENDENT_AMBULATORY_CARE_PROVIDER_SITE_OTHER): Payer: Self-pay | Admitting: Ophthalmology

## 2020-07-12 ENCOUNTER — Other Ambulatory Visit: Payer: Self-pay

## 2020-07-12 ENCOUNTER — Ambulatory Visit (INDEPENDENT_AMBULATORY_CARE_PROVIDER_SITE_OTHER): Payer: Medicare Other | Admitting: Ophthalmology

## 2020-07-12 DIAGNOSIS — H2513 Age-related nuclear cataract, bilateral: Secondary | ICD-10-CM | POA: Diagnosis not present

## 2020-07-12 DIAGNOSIS — H33311 Horseshoe tear of retina without detachment, right eye: Secondary | ICD-10-CM

## 2020-07-12 DIAGNOSIS — H33312 Horseshoe tear of retina without detachment, left eye: Secondary | ICD-10-CM | POA: Diagnosis not present

## 2020-07-12 HISTORY — DX: Age-related nuclear cataract, bilateral: H25.13

## 2020-07-12 NOTE — Assessment & Plan Note (Signed)
D good retinopexy will observe no new breaks OU

## 2020-07-12 NOTE — Assessment & Plan Note (Signed)
The nature of cataract was discussed with the patient as well as the elective nature of surgery. The patient was reassured that surgery at a later date does not put the patient at risk for a worse outcome. It was emphasized that the need for surgery is dictated by the patient's quality of life as influenced by the cataract. Patient was instructed to maintain close follow up with their general eye care doctor.  We will discussed with the patient the nature of cataracts, particularly as like dark sun shades thus his first symptoms will be difficulty seeing in dim light, or driving it dark to laying roads with oncoming headlights

## 2020-07-12 NOTE — Assessment & Plan Note (Signed)
OS, good retinopexy will observe

## 2020-07-12 NOTE — Progress Notes (Signed)
07/12/2020     CHIEF COMPLAINT Patient presents for Retina Follow Up (5 Month f\u OU. FP/Pt states vision is stable. Pt states there has been an increase in floaters OD since he first has the horseshoe tear)   HISTORY OF PRESENT ILLNESS: Richard Macdonald is a 75 y.o. male who presents to the clinic today for:   HPI    Retina Follow Up    Patient presents with  Retinal Break/Detachment.  In right eye.  Severity is moderate.  Duration of 5 months.  Since onset it is stable.  I, the attending physician,  performed the HPI with the patient and updated documentation appropriately. Additional comments: 5 Month f\u OU. FP Pt states vision is stable. Pt states there has been an increase in floaters OD since he first has the horseshoe tear       Last edited by Tilda Franco on 07/12/2020  2:04 PM. (History)      Referring physician: Tammi Sou, MD [address] [place],  [place] [address]  HISTORICAL INFORMATION:   Selected notes from the MEDICAL RECORD NUMBER    Lab Results  Component Value Date   HGBA1C 5.5 11/05/2014     CURRENT MEDICATIONS: No current outpatient medications on file. (Ophthalmic Drugs)   No current facility-administered medications for this visit. (Ophthalmic Drugs)   Current Outpatient Medications (Other)  Medication Sig  . aspirin 81 MG tablet Take 81 mg by mouth daily.    No current facility-administered medications for this visit. (Other)      REVIEW OF SYSTEMS:    ALLERGIES No Known Allergies  PAST MEDICAL HISTORY Past Medical History:  Diagnosis Date  . Allergy   . 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 Hepatitis A 1968  . TIA (transient ischemic attack) 12/2012   July.  Right thumb numbness and dysarthria.  Full w/u (EKG, echo, carotids, MRI brain) unrevealing; at the time was on no ASA, was put on 1/4 ASA qd after and has been fine.  . Torn meniscus    R knee.  Dr. Oneal Grout.   Past Surgical History:   Procedure Laterality Date  . 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 COLONOSCOPY  2007; 02/2016   Normal 2007.  2017 repeat showed left colon diverticulosis and internal hemorrhoids, otherwise normal.  No repeat recommended due to pt age.  Marland Kitchen HEMORRHOID SURGERY  1999    FAMILY HISTORY Family History  Problem Relation Age of Onset  . Prostate cancer Father   . Breast cancer Sister   . Colon cancer Neg Hx   . Colon polyps Neg Hx   . Esophageal cancer Neg Hx   . Rectal cancer Neg Hx   . Stomach cancer Neg Hx     SOCIAL HISTORY Social History   Tobacco Use  . Smoking status: Former Smoker    Quit date: 07/04/1971    Years since quitting: 49.0  . Smokeless tobacco: Never Used  Vaping Use  . Vaping Use: Never used  Substance Use Topics  . Alcohol use: Yes    Alcohol/week: 4.0 standard drinks    Types: 4 Glasses of wine per week    Comment: 3-4 days a week  . Drug use: No         OPHTHALMIC EXAM:  Base Eye Exam    Visual Acuity (Snellen - Linear)      Right Left  Dist cc 20/25 + 20/30 -1   Correction: Glasses       Tonometry (Tonopen, 2:07 PM)      Right Left   Pressure 15 14       Pupils      Pupils Dark Light Shape React APD   Right PERRL 3 3 Round Minimal None   Left PERRL 3 3 Round Minimal None       Visual Fields (Counting fingers)      Left Right    Full Full       Neuro/Psych    Oriented x3: Yes   Mood/Affect: Normal       Dilation    Both eyes: 1.0% Mydriacyl, 2.5% Phenylephrine @ 2:07 PM        Slit Lamp and Fundus Exam    External Exam      Right Left   External Normal Normal       Slit Lamp Exam      Right Left   Lids/Lashes Normal Normal   Conjunctiva/Sclera White and quiet White and quiet   Cornea Clear Clear   Anterior Chamber Deep and quiet Deep and quiet   Iris Round and reactive Round and reactive   Lens 2+ Nuclear sclerosis 2+ Nuclear sclerosis   Anterior Vitreous Normal Normal       Fundus Exam       Right Left   Posterior Vitreous Posterior vitreous detachment, Central vitreous floaters Posterior vitreous detachment   Disc Normal Peripapillary atrophy   C/D Ratio 0.6 0.6   Macula Normal Normal   Vessels Normal Normal   Periphery good retinopexyRetinal tear at 11:30  Good pexy,, , pigment at 7, poor detail view, pigment at 8,  Retinal Tear at 1:00 good pexy, good laser at 12 Retinal tear at 12 o clock , pigment at 6          IMAGING AND PROCEDURES  Imaging and Procedures for 07/12/20  Color Fundus Photography Optos - OU - Both Eyes       Right Eye Progression has been stable. Macula : normal observations. Vessels : normal observations.   Left Eye Progression has been stable. Disc findings include normal observations. Vessels : normal observations.   Notes Posterior vitreous detachment OS, with large central vitreous floater  OD, similar posterior vitreous detachment, small vitreous floater, no new retinal breaks                   ASSESSMENT/PLAN:  Cataract, nuclear sclerotic, both eyes The nature of cataract was discussed with the patient as well as the elective nature of surgery. The patient was reassured that surgery at a later date does not put the patient at risk for a worse outcome. It was emphasized that the need for surgery is dictated by the patient's quality of life as influenced by the cataract. Patient was instructed to maintain close follow up with their general eye care doctor.  We will discussed with the patient the nature of cataracts, particularly as like dark sun shades thus his first symptoms will be difficulty seeing in dim light, or driving it dark to laying roads with oncoming headlights  Horseshoe retinal tear of left eye OS, good retinopexy will observe  Horseshoe tear of retina of right eye without detachment D good retinopexy will observe no new breaks OU      ICD-10-CM   1. Horseshoe tear of retina of right eye without detachment   H33.311 Color Fundus Photography Optos -  OU - Both Eyes  2. Cataract, nuclear sclerotic, both eyes  H25.13   3. Horseshoe retinal tear of left eye  H33.312     1.  2.  3.  Ophthalmic Meds Ordered this visit:  No orders of the defined types were placed in this encounter.      Return in about 6 months (around 01/09/2021) for DILATE OU.  There are no Patient Instructions on file for this visit.   Explained the diagnoses, plan, and follow up with the patient and they expressed understanding.  Patient expressed understanding of the importance of proper follow up care.   Clent Demark Babita Amaker M.D. Diseases & Surgery of the Retina and Vitreous Retina & Diabetic [place] 07/12/20     Abbreviations: M myopia (nearsighted); A astigmatism; H hyperopia (farsighted); P presbyopia; Mrx spectacle prescription;  CTL contact lenses; OD right eye; OS left eye; OU both eyes  XT exotropia; ET esotropia; PEK punctate epithelial keratitis; PEE punctate epithelial erosions; DES dry eye syndrome; MGD meibomian gland dysfunction; ATs artificial tears; PFAT's preservative free artificial tears; [place] nuclear sclerotic cataract; PSC posterior subcapsular cataract; ERM epi-retinal membrane; PVD posterior vitreous detachment; RD retinal detachment; DM diabetes mellitus; DR diabetic retinopathy; NPDR non-proliferative diabetic retinopathy; PDR proliferative diabetic retinopathy; CSME clinically significant macular edema; DME diabetic macular edema; dbh dot blot hemorrhages; CWS cotton wool spot; POAG primary open angle glaucoma; C/D cup-to-disc ratio; HVF humphrey visual field; GVF goldmann visual field; OCT optical coherence tomography; IOP intraocular pressure; BRVO Branch retinal vein occlusion; CRVO central retinal vein occlusion; CRAO central retinal artery occlusion; BRAO branch retinal artery occlusion; RT retinal tear; SB scleral buckle; PPV pars plana vitrectomy; VH Vitreous hemorrhage; PRP panretinal laser  photocoagulation; IVK intravitreal kenalog; VMT vitreomacular traction; MH Macular hole;  NVD neovascularization of the disc; NVE neovascularization elsewhere; AREDS age related eye disease study; ARMD age related macular degeneration; POAG primary open angle glaucoma; EBMD epithelial/anterior basement membrane dystrophy; ACIOL anterior chamber intraocular lens; IOL intraocular lens; PCIOL posterior chamber intraocular lens; Phaco/IOL phacoemulsification with intraocular lens placement; [place] photorefractive keratectomy; LASIK laser assisted in situ keratomileusis; HTN hypertension; DM diabetes mellitus; COPD chronic obstructive pulmonary disease

## 2020-11-24 ENCOUNTER — Telehealth: Payer: Self-pay | Admitting: Family Medicine

## 2020-11-24 NOTE — Telephone Encounter (Signed)
Left message for patient to schedule Annual Wellness Visit.  Please schedule with Nurse Health Advisor Caroleen Hamman, RN at [facility].

## 2020-12-03 ENCOUNTER — Ambulatory Visit (INDEPENDENT_AMBULATORY_CARE_PROVIDER_SITE_OTHER): Payer: Medicare Other | Admitting: Family Medicine

## 2020-12-03 ENCOUNTER — Encounter: Payer: Self-pay | Admitting: Family Medicine

## 2020-12-03 ENCOUNTER — Other Ambulatory Visit: Payer: Self-pay

## 2020-12-03 VITALS — BP 122/72 | HR 62 | Temp 97.7°F | Wt 178.0 lb

## 2020-12-03 DIAGNOSIS — L720 Epidermal cyst: Secondary | ICD-10-CM | POA: Diagnosis not present

## 2020-12-03 NOTE — Progress Notes (Signed)
OFFICE VISIT  12/03/2020  CC:  Chief Complaint  Patient presents with  . Lump In Armpit    Right arm    HPI:    Patient is a 75 y.o. male who presents for "lump in armpit". R axillary nodule present for 4-5 yrs, pt notes it is enlarging but he's not completely sure. No pain.  No hx of drainage.  Past Medical History:  Diagnosis Date  . Allergy   . 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 Family history of prostate cancer in father   . Hepatitis A 1968  . Olecranon bursitis, right elbow 2021  . TIA (transient ischemic attack) 12/2012   July.  Right thumb numbness and dysarthria.  Full w/u (EKG, echo, carotids, MRI brain) unrevealing; at the time was on no ASA, was put on 1/4 ASA qd after and has been fine.  . Torn meniscus    R knee.  Dr. Oneal Grout.    Past Surgical History:  Procedure Laterality Date  . 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 COLONOSCOPY  2007; 02/2016   Normal 2007.  2017 repeat showed left colon diverticulosis and internal hemorrhoids, otherwise normal.  No repeat recommended due to pt age.  Marland Kitchen [place]    Outpatient Medications Prior to Visit  Medication Sig Dispense Refill  . aspirin 81 MG tablet Take 81 mg by mouth daily.      No facility-administered medications prior to visit.    No Known Allergies  ROS As per HPI  PE: Vitals with BMI 12/03/2020 01/14/2020 11/06/2019  Height - 5\' 10"  -  Weight 178 lbs 179 lbs 3 oz -  BMI - 78.29 -  Systolic 562 130 865  Diastolic 72 78 77  Pulse 62 64 67     Gen: Alert, well appearing.  Patient is oriented to person, place, time, and situation. AFFECT: pleasant, lucid thought and speech. R axilla with superficial-feeling subQ nodule that is soft and flesh colored and easily mobile---approx 1 cm diameter. No erythema, no tenderness.  No surrounding induration.  No axillary adenopathy.  LABS:    Chemistry      Component Value Date/Time   NA 139 11/05/2019 2155   K  3.9 11/05/2019 2155   CL 101 11/05/2019 2155   CO2 27 11/05/2019 2119   BUN 21 11/05/2019 2155   CREATININE 0.90 11/05/2019 2155   CREATININE 1.14 11/17/2015 0952      Component Value Date/Time   CALCIUM 9.2 11/05/2019 2119   ALKPHOS 63 11/05/2019 2119   AST 17 11/05/2019 2119   ALT 16 11/05/2019 2119   BILITOT 0.9 11/05/2019 2119     Lab Results  Component Value Date   WBC 7.1 11/05/2019   HGB 14.3 11/05/2019   HCT 42.0 11/05/2019   MCV 92.2 11/05/2019   PLT 181 11/05/2019   Lab Results  Component Value Date   HGBA1C 5.5 11/05/2014   Lab Results  Component Value Date   CHOL 156 01/23/2017   HDL 45.90 01/23/2017   LDLCALC 97 01/23/2017   TRIG 67.0 01/23/2017   CHOLHDL 3 01/23/2017   Lab Results  Component Value Date   PSA 2.71 10/16/2019   PSA 3.12 08/05/2018   PSA 3.67 01/23/2017   IMPRESSION AND PLAN:  Epidermal inclusion cyst R axilla.  Not infected. Reassured pt, encouraged either obs only vs warm compress to area 60min 1-2 times per day to encourage spontaneous  drainage. I&d not recommended at this time but can do this if it becomes signif enlarged and/or uncomfortable for him. Signs/symptoms to call or return for were reviewed and pt expressed understanding.  An After Visit Summary was printed and given to the patient.  FOLLOW UP: Return if symptoms worsen or fail to improve.  Signed:  Crissie Sickles, MD           12/03/2020

## 2021-01-10 ENCOUNTER — Encounter (INDEPENDENT_AMBULATORY_CARE_PROVIDER_SITE_OTHER): Payer: Medicare Other | Admitting: Ophthalmology

## 2021-01-24 ENCOUNTER — Telehealth: Payer: Self-pay | Admitting: Family Medicine

## 2021-01-24 NOTE — Telephone Encounter (Signed)
Left message for patient to call back and schedule Medicare Annual Wellness Visit (AWV).   Please offer to do virtually or by telephone.   Last AWV:11/17/2015  Please schedule at anytime with Nurse Health Advisor.

## 2021-02-01 ENCOUNTER — Other Ambulatory Visit: Payer: Self-pay

## 2021-02-01 ENCOUNTER — Ambulatory Visit (INDEPENDENT_AMBULATORY_CARE_PROVIDER_SITE_OTHER): Payer: Medicare Other | Admitting: Ophthalmology

## 2021-02-01 ENCOUNTER — Encounter (INDEPENDENT_AMBULATORY_CARE_PROVIDER_SITE_OTHER): Payer: Self-pay | Admitting: Ophthalmology

## 2021-02-01 DIAGNOSIS — H33312 Horseshoe tear of retina without detachment, left eye: Secondary | ICD-10-CM

## 2021-02-01 DIAGNOSIS — H2513 Age-related nuclear cataract, bilateral: Secondary | ICD-10-CM | POA: Diagnosis not present

## 2021-02-01 DIAGNOSIS — H33311 Horseshoe tear of retina without detachment, right eye: Secondary | ICD-10-CM | POA: Diagnosis not present

## 2021-02-01 NOTE — Assessment & Plan Note (Signed)
Progressive NSC deterioration with central nuclear sclerotic changes accounting for acuity changes in the left eye and overall darkening of the vision.  These findings relayed to the patient have asked patient to follow-up with his ophthalmologist Dr. Rutherford Guys

## 2021-02-01 NOTE — Assessment & Plan Note (Signed)
Good retinopexy no new breaks left

## 2021-02-01 NOTE — Assessment & Plan Note (Signed)
Good retinopexy to tears superiorly no new breaks

## 2021-02-01 NOTE — Progress Notes (Signed)
02/01/2021     CHIEF COMPLAINT Patient presents for Retina Follow Up (6 month fu OU and OCT/Pt states, "I am not having any new issues. My vision seems to be about the same. I still have days when I have floaters. "/)   HISTORY OF PRESENT ILLNESS: Richard Macdonald is a 75 y.o. male who presents to the clinic today for:   HPI     Retina Follow Up           Diagnosis: Retinal Break/Detachment   Laterality: both eyes   Onset: 6 months ago   Severity: mild   Duration: 6 months   Course: stable   Comments: 6 month fu OU and OCT Pt states, "I am not having any new issues. My vision seems to be about the same. I still have days when I have floaters. "        Last edited by Kendra Opitz, COA on 02/01/2021  2:14 PM.      Referring physician: Rutherford Guys, [place],  [place] [address]  HISTORICAL INFORMATION:   Selected notes from the MEDICAL RECORD NUMBER    Lab Results  Component Value Date   HGBA1C 5.5 11/05/2014     CURRENT MEDICATIONS: No current outpatient medications on file. (Ophthalmic Drugs)   No current facility-administered medications for this visit. (Ophthalmic Drugs)   Current Outpatient Medications (Other)  Medication Sig   aspirin 81 MG tablet Take 81 mg by mouth daily.    No current facility-administered medications for this visit. (Other)      REVIEW OF SYSTEMS:    ALLERGIES No Known Allergies  PAST MEDICAL HISTORY Past Medical History:  Diagnosis Date   Allergy    Deafness in right ear    Hearing aids since 2016   Family history of prostate cancer in father    Hepatitis A 1968   Olecranon bursitis, right elbow 2021   TIA (transient ischemic attack) 12/2012   July.  Right thumb numbness and dysarthria.  Full w/u (EKG, echo, carotids, MRI brain) unrevealing; at the time was on no ASA, was put on 1/4 ASA qd after and has been fine.   Torn meniscus    R knee.  Dr. Oneal Grout.   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  2017 repeat showed left colon diverticulosis and internal hemorrhoids, otherwise normal.  No repeat recommended due to pt age.   HEMORRHOID SURGERY  1999    FAMILY HISTORY Family History  Problem Relation Age of Onset   Prostate cancer Father    Breast cancer Sister    Colon cancer Neg Hx    Colon polyps Neg Hx    Esophageal cancer Neg Hx    Rectal cancer Neg Hx    Stomach cancer Neg Hx     SOCIAL HISTORY Social History   Tobacco Use   Smoking status: Former    Types: Cigarettes    Quit date: 07/04/1971    Years since quitting: 49.6   Smokeless tobacco: Never  Vaping Use   Vaping Use: Never used  Substance Use Topics   Alcohol use: Yes    Alcohol/week: 4.0 standard drinks    Types: 4 Glasses of wine per week    Comment: 3-4 days a week   Drug use: No  OPHTHALMIC EXAM:  Base Eye Exam     Visual Acuity (ETDRS)       Right Left   Dist cc 20/25 -1 20/40   Dist ph cc  20/30    Correction: Glasses         Tonometry (Tonopen, 2:17 PM)       Right Left   Pressure 10 10         Pupils       Pupils Dark Light Shape React APD   Right PERRL 3 3 Round Minimal None   Left PERRL 3 3 Round Minimal None         Visual Fields (Counting fingers)       Left Right    Full Full         Extraocular Movement       Right Left    Full Full         Neuro/Psych     Oriented x3: Yes   Mood/Affect: Normal         Dilation     Both eyes: 1.0% Mydriacyl, 2.5% Phenylephrine @ 2:17 PM           Slit Lamp and Fundus Exam     External Exam       Right Left   External Normal Normal         Slit Lamp Exam       Right Left   Lids/Lashes Normal Normal   Conjunctiva/Sclera White and quiet White and quiet   Cornea Clear Clear   Anterior Chamber Deep and quiet Deep and quiet   Iris Round and reactive Round and reactive    Lens 3+ Nuclear sclerosis 3+ Nuclear sclerosis   Anterior Vitreous Normal Normal         Fundus Exam       Right Left   Posterior Vitreous Posterior vitreous detachment, Central vitreous floaters Posterior vitreous detachment   Disc Normal Peripapillary atrophy   C/D Ratio 0.6 0.6   Macula Normal Normal   Vessels Normal Normal   Periphery good retinopexy Retinal tear at 11:30  Good pexy,, , pigment at 7, poor detail view, pigment at 8,  Retinal Tear at 1:00 good pexy, good laser at 12 Retinal tear at 12 o clock, good pexy, pigment at 6            IMAGING AND PROCEDURES  Imaging and Procedures for 02/01/21  OCT, Retina - OU - Both Eyes       Right Eye Quality was good. Scan locations included subfoveal. Central Foveal Thickness: 287. Progression has been stable. Findings include normal foveal contour.   Left Eye Quality was good. Scan locations included subfoveal. Central Foveal Thickness: 291. Progression has been stable. Findings include normal foveal contour.   Notes Posterior vitreous detachment apparent OS.             ASSESSMENT/PLAN:  Cataract, nuclear sclerotic, both eyes Progressive NSC deterioration with central nuclear sclerotic changes accounting for acuity changes in the left eye and overall darkening of the vision.  These findings relayed to the patient have asked patient to follow-up with his ophthalmologist Dr. Rutherford Guys  Horseshoe retinal tear of left eye Good retinopexy no new breaks left  Horseshoe tear of retina of right eye without detachment Good retinopexy to tears superiorly no new breaks     ICD-10-CM   1. Horseshoe tear of retina of right eye without detachment  H33.311 OCT, Retina - OU -  Both Eyes    2. Horseshoe retinal tear of left eye  H33.312 OCT, Retina - OU - Both Eyes    3. Cataract, nuclear sclerotic, both eyes  H25.13       1.  No new retinal breaks or tears will observe OU  2.  Progressive nuclear sclerotic  cataract in each eye accounts for darkening of vision and change in visual acuity in the left eye  3.  Ophthalmic Meds Ordered this visit:  No orders of the defined types were placed in this encounter.      Return in about 1 year (around 02/01/2022) for DILATE OU, COLOR FP.  There are no Patient Instructions on file for this visit.   Explained the diagnoses, plan, and follow up with the patient and they expressed understanding.  Patient expressed understanding of the importance of proper follow up care.   Clent Demark Karen Huhta M.D. Diseases & Surgery of the Retina and Vitreous Retina & Diabetic [place] 02/01/21     Abbreviations: M myopia (nearsighted); A astigmatism; H hyperopia (farsighted); P presbyopia; Mrx spectacle prescription;  CTL contact lenses; OD right eye; OS left eye; OU both eyes  XT exotropia; ET esotropia; PEK punctate epithelial keratitis; PEE punctate epithelial erosions; DES dry eye syndrome; MGD meibomian gland dysfunction; ATs artificial tears; PFAT's preservative free artificial tears; [place] nuclear sclerotic cataract; PSC posterior subcapsular cataract; ERM epi-retinal membrane; PVD posterior vitreous detachment; RD retinal detachment; DM diabetes mellitus; DR diabetic retinopathy; NPDR non-proliferative diabetic retinopathy; PDR proliferative diabetic retinopathy; CSME clinically significant macular edema; DME diabetic macular edema; dbh dot blot hemorrhages; CWS cotton wool spot; POAG primary open angle glaucoma; C/D cup-to-disc ratio; HVF humphrey visual field; GVF goldmann visual field; OCT optical coherence tomography; IOP intraocular pressure; BRVO Branch retinal vein occlusion; CRVO central retinal vein occlusion; CRAO central retinal artery occlusion; BRAO branch retinal artery occlusion; RT retinal tear; SB scleral buckle; PPV pars plana vitrectomy; VH Vitreous hemorrhage; PRP panretinal laser photocoagulation; IVK intravitreal kenalog; VMT vitreomacular traction;  MH Macular hole;  NVD neovascularization of the disc; NVE neovascularization elsewhere; AREDS age related eye disease study; ARMD age related macular degeneration; POAG primary open angle glaucoma; EBMD epithelial/anterior basement membrane dystrophy; ACIOL anterior chamber intraocular lens; IOL intraocular lens; PCIOL posterior chamber intraocular lens; Phaco/IOL phacoemulsification with intraocular lens placement; [place] photorefractive keratectomy; LASIK laser assisted in situ keratomileusis; HTN hypertension; DM diabetes mellitus; COPD chronic obstructive pulmonary disease

## 2021-02-09 DIAGNOSIS — H5213 Myopia, bilateral: Secondary | ICD-10-CM | POA: Diagnosis not present

## 2021-02-09 DIAGNOSIS — H52203 Unspecified astigmatism, bilateral: Secondary | ICD-10-CM | POA: Diagnosis not present

## 2021-02-09 DIAGNOSIS — H524 Presbyopia: Secondary | ICD-10-CM | POA: Diagnosis not present

## 2021-02-09 DIAGNOSIS — H2513 Age-related nuclear cataract, bilateral: Secondary | ICD-10-CM | POA: Diagnosis not present

## 2021-02-23 DIAGNOSIS — H2511 Age-related nuclear cataract, right eye: Secondary | ICD-10-CM | POA: Diagnosis not present

## 2021-02-23 DIAGNOSIS — H2512 Age-related nuclear cataract, left eye: Secondary | ICD-10-CM | POA: Diagnosis not present

## 2021-03-02 DIAGNOSIS — H2511 Age-related nuclear cataract, right eye: Secondary | ICD-10-CM | POA: Diagnosis not present

## 2021-04-12 ENCOUNTER — Encounter: Payer: Self-pay | Admitting: Family Medicine

## 2021-04-12 ENCOUNTER — Other Ambulatory Visit: Payer: Self-pay

## 2021-04-12 ENCOUNTER — Ambulatory Visit (INDEPENDENT_AMBULATORY_CARE_PROVIDER_SITE_OTHER): Payer: Medicare Other | Admitting: Family Medicine

## 2021-04-12 VITALS — BP 113/69 | HR 58 | Temp 97.7°F | Ht 70.0 in | Wt 181.4 lb

## 2021-04-12 DIAGNOSIS — Z131 Encounter for screening for diabetes mellitus: Secondary | ICD-10-CM | POA: Diagnosis not present

## 2021-04-12 DIAGNOSIS — Z125 Encounter for screening for malignant neoplasm of prostate: Secondary | ICD-10-CM

## 2021-04-12 DIAGNOSIS — E785 Hyperlipidemia, unspecified: Secondary | ICD-10-CM

## 2021-04-12 DIAGNOSIS — Z8673 Personal history of transient ischemic attack (TIA), and cerebral infarction without residual deficits: Secondary | ICD-10-CM

## 2021-04-12 LAB — CBC WITH DIFFERENTIAL/PLATELET
Basophils Absolute: 0 10*3/uL (ref 0.0–0.1)
Basophils Relative: 0.6 % (ref 0.0–3.0)
Eosinophils Absolute: 0.1 10*3/uL (ref 0.0–0.7)
Eosinophils Relative: 2.6 % (ref 0.0–5.0)
HCT: 42.2 % (ref 39.0–52.0)
Hemoglobin: 13.9 g/dL (ref 13.0–17.0)
Lymphocytes Relative: 30.2 % (ref 12.0–46.0)
Lymphs Abs: 1.5 10*3/uL (ref 0.7–4.0)
MCHC: 33 g/dL (ref 30.0–36.0)
MCV: 91.3 fl (ref 78.0–100.0)
Monocytes Absolute: 0.5 10*3/uL (ref 0.1–1.0)
Monocytes Relative: 9.5 % (ref 3.0–12.0)
Neutro Abs: 2.8 10*3/uL (ref 1.4–7.7)
Neutrophils Relative %: 57.1 % (ref 43.0–77.0)
Platelets: 169 10*3/uL (ref 150.0–400.0)
RBC: 4.62 Mil/uL (ref 4.22–5.81)
RDW: 14.4 % (ref 11.5–15.5)
WBC: 4.8 10*3/uL (ref 4.0–10.5)

## 2021-04-12 LAB — LIPID PANEL
Cholesterol: 147 mg/dL (ref 0–200)
HDL: 41.3 mg/dL (ref >39.00)
LDL Cholesterol: 88 mg/dL (ref 0–99)
NonHDL: 105.3
Total CHOL/HDL Ratio: 4
Triglycerides: 87 mg/dL (ref 0.0–149.0)
VLDL: 17.4 mg/dL (ref 0.0–40.0)

## 2021-04-12 LAB — COMPREHENSIVE METABOLIC PANEL
ALT: 17 U/L (ref 0–53)
AST: 17 U/L (ref 0–37)
Albumin: 4.2 g/dL (ref 3.5–5.2)
Alkaline Phosphatase: 58 U/L (ref 39–117)
BUN: 15 mg/dL (ref 6–23)
CO2: 29 mEq/L (ref 19–32)
Calcium: 8.9 mg/dL (ref 8.4–10.5)
Chloride: 105 mEq/L (ref 96–112)
Creatinine, Ser: 1.03 mg/dL (ref 0.40–1.50)
GFR: 71.01 mL/min (ref >60.00)
Glucose, Bld: 90 mg/dL (ref 70–99)
Potassium: 4.1 mEq/L (ref 3.5–5.1)
Sodium: 140 mEq/L (ref 135–145)
Total Bilirubin: 0.8 mg/dL (ref 0.2–1.2)
Total Protein: 6.8 g/dL (ref 6.0–8.3)

## 2021-04-12 LAB — PSA, MEDICARE: PSA: 3.02 ng/ml (ref 0.10–4.00)

## 2021-04-12 MED ORDER — TETANUS-DIPHTH-ACELL PERTUSSIS 5-2-15.5 LF-MCG/0.5 IM SUSP: 0.5000 mL | Freq: Once | INTRAMUSCULAR | 0 refills | Status: AC

## 2021-04-12 NOTE — Progress Notes (Signed)
Office Note 04/12/2021  CC:  Chief Complaint  Patient presents with   Follow-up    RCI    HPI:  Patient is a 75 y.o. male who is here for annual f/u for remote hx of TIA, FH prostate cancer, and review of health maintenance needs. A/P as of 10/16/19 follow up: "Hx of TIA: no recurrence of any neurologic symptoms. He is active and eats healthy. Monitor metabolic panel today. FH of prostate ca/prostate ca screening: DRE normal today.  PSA sent. Colon ca screening: no further screening is indicated in this patient. Vaccines: all UTD.  Shingrix discussed->rx sent to pharmacy today".  INTERIM HX: Feeling well, no complaints. He will be traveling with his wife to [place] in a few weeks. He is active with hiking and he goes to the gym every other day.  Diet is healthy.  He takes an aspirin a day.   Past Medical History:  Diagnosis Date   Allergy    Deafness in right ear    Hearing aids since 2016   Family history of prostate cancer in father    Hepatitis A 1968   Olecranon bursitis, right elbow 2021   TIA (transient ischemic attack) 12/2012   July.  Right thumb numbness and dysarthria.  Full w/u (EKG, echo, carotids, MRI brain) unrevealing; at the time was on no ASA, was put on 1/4 ASA qd after and has been fine.   Torn meniscus    R knee.  Dr. Oneal Grout.    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  2017 repeat showed left colon diverticulosis and internal hemorrhoids, otherwise normal.  No repeat recommended due to pt age.   HEMORRHOID SURGERY  1999    Family History  Problem Relation Age of Onset   Prostate cancer Father    Breast cancer Sister    Colon cancer Neg Hx    Colon polyps Neg Hx    Esophageal cancer Neg Hx    Rectal cancer Neg Hx    Stomach cancer Neg Hx     Social History   Socioeconomic History   Marital status: Married    Spouse name: Not on file    Number of children: Not on file   Years of education: Not on file   Highest education level: Not on file  Occupational History   Not on file  Tobacco Use   Smoking status: Former    Types: Cigarettes    Quit date: 07/04/1971    Years since quitting: 49.8   Smokeless tobacco: Never  Vaping Use   Vaping Use: Never used  Substance and Sexual Activity   Alcohol use: Yes    Alcohol/week: 4.0 standard drinks    Types: 4 Glasses of wine per week    Comment: 3-4 days a week   Drug use: No   Sexual activity: Not on file  Other Topics Concern   Not on file  Social History Narrative   Married, 3 children in their 42s.   College educated.   Occupation: retired from [employer] (42 yrs).   Former smoker: 10 pack-yr hx, quit 1973.   Alcohol: 4-5 glasses of wine per week.   Exercises 3 days/week minimum x 1.5 hours; [employer], bike, hiking.   Social Determinants of Health   Financial Resource Strain: Not on file  Food Insecurity: Not on file  Transportation Needs:  Not on file  Physical Activity: Not on file  Stress: Not on file  Social Connections: Not on file  Intimate Partner Violence: Not on file    Outpatient Medications Prior to Visit  Medication Sig Dispense Refill   aspirin 81 MG tablet Take 81 mg by mouth daily.      No facility-administered medications prior to visit.    No Known Allergies  ROS ROS as above, plus--> no fevers, no CP, no SOB, no wheezing, no cough, no dizziness, no HAs, no rashes, no melena/hematochezia.  No polyuria or polydipsia.  No myalgias or arthralgias.  No focal weakness, paresthesias, or tremors.  No acute vision or hearing abnormalities.  No dysuria or unusual/new urinary urgency or frequency.  No recent changes in lower legs. No n/v/d or abd pain.  No palpitations.    PE; Vitals with BMI 04/12/2021 12/03/2020 01/14/2020  Height 5\' 10"  - 5\' 10"   Weight 181 lbs 6 oz 178 lbs 179 lbs 3 oz  BMI 62.95 - 28.41  Systolic 324 401 027   Diastolic 69 72 78  Pulse 58 62 64   Gen: Alert, well appearing.  Patient is oriented to person, place, time, and situation. AFFECT: pleasant, lucid thought and speech. ENT: Ears: EACs clear, normal epithelium.  TMs with good light reflex and landmarks bilaterally.  Eyes: no injection, icteris, swelling, or exudate.  EOMI, PERRLA. Nose: no drainage or turbinate edema/swelling.  No injection or focal lesion.  Mouth: lips without lesion/swelling.  Oral mucosa pink and moist.  Dentition intact and without obvious caries or gingival swelling.  Oropharynx without erythema, exudate, or swelling.  Neck: supple/nontender.  No LAD, mass, or TM.  Carotid pulses 2+ bilaterally, without bruits. CV: RRR, no m/r/g.   LUNGS: CTA bilat, nonlabored resps, good aeration in all lung fields. ABD: soft, NT, ND, BS normal.  No hepatospenomegaly or mass.  No bruits. EXT: no clubbing, cyanosis, or edema.  Musculoskeletal: no joint swelling, erythema, warmth, or tenderness.  ROM of all joints intact. Skin - no sores or suspicious lesions or rashes or color changes  Pertinent labs:  No results found for: TSH Lab Results  Component Value Date   WBC 7.1 11/05/2019   HGB 14.3 11/05/2019   HCT 42.0 11/05/2019   MCV 92.2 11/05/2019   PLT 181 11/05/2019   Lab Results  Component Value Date   CREATININE 0.90 11/05/2019   BUN 21 11/05/2019   NA 139 11/05/2019   K 3.9 11/05/2019   CL 101 11/05/2019   CO2 27 11/05/2019   Lab Results  Component Value Date   ALT 16 11/05/2019   AST 17 11/05/2019   ALKPHOS 63 11/05/2019   BILITOT 0.9 11/05/2019   Lab Results  Component Value Date   CHOL 156 01/23/2017   Lab Results  Component Value Date   HDL 45.90 01/23/2017   Lab Results  Component Value Date   LDLCALC 97 01/23/2017   Lab Results  Component Value Date   TRIG 67.0 01/23/2017   Lab Results  Component Value Date   CHOLHDL 3 01/23/2017   Lab Results  Component Value Date   PSA 2.71 10/16/2019    PSA 3.12 08/05/2018   PSA 3.67 01/23/2017   Lab Results  Component Value Date   HGBA1C 5.5 11/05/2014   ASSESSMENT AND PLAN:   #1 history of TIA and mild hyperlipidemia (LDL 120 in 2016). His last fasting lipid panel was in 2018 so we will repeat this today. Continue  aspirin 81 mg a day.  1) Health maintenance: Reviewed age and gender appropriate health maintenance issues (prudent diet, regular exercise, health risks of tobacco and excessive alcohol, use of seatbelts, fire alarms in home, use of sunscreen).  Also reviewed age and gender appropriate health screening as well as vaccine recommendations. Vaccines: Tdap->rx sent to pharmacy. Flu->pt to get this later this week.  Shingrix->UTD.  Otherwise ALL UTD. Labs: cbc w/diff, cmet, flp, psa. Prostate ca screening: PSA. Colon ca screening: 2017 repeat showed left colon diverticulosis and internal hemorrhoids, otherwise normal.  No repeat recommended due to pt age  An After Visit Summary was printed and given to the patient.  FOLLOW UP:  No follow-ups on file.  Signed:  Crissie Sickles, MD           04/12/2021

## 2021-04-14 DIAGNOSIS — Z23 Encounter for immunization: Secondary | ICD-10-CM | POA: Diagnosis not present

## 2021-08-02 DIAGNOSIS — Z961 Presence of intraocular lens: Secondary | ICD-10-CM | POA: Diagnosis not present

## 2021-08-20 ENCOUNTER — Encounter (HOSPITAL_COMMUNITY): Payer: Self-pay | Admitting: Emergency Medicine

## 2021-08-20 ENCOUNTER — Ambulatory Visit (HOSPITAL_COMMUNITY)
Admission: EM | Admit: 2021-08-20 | Discharge: 2021-08-20 | Disposition: A | Discharge status: Home / Self Care | Payer: Medicare Other | Attending: Urgent Care | Admitting: Urgent Care

## 2021-08-20 DIAGNOSIS — H0015 Chalazion left lower eyelid: Secondary | ICD-10-CM | POA: Diagnosis not present

## 2021-08-20 DIAGNOSIS — L718 Other rosacea: Secondary | ICD-10-CM

## 2021-08-20 MED ORDER — ERYTHROMYCIN 5 MG/GM OP OINT: TOPICAL_OINTMENT | OPHTHALMIC | 0 refills | Status: DC

## 2021-08-20 MED ORDER — DOXYCYCLINE HYCLATE 100 MG PO CAPS: 100.0000 mg | ORAL_CAPSULE | Freq: Two times a day (BID) | ORAL | 0 refills | Status: DC

## 2021-08-20 NOTE — Discharge Instructions (Addendum)
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 Please take the doxycycline twice daily until gone. Use the erythromycin eye ointment in the eye 3 times daily x5 days. Use the yellow [employer] baby shampoo on a warm moist washcloth to help cleanse the eye.  Start taking the antibiotic twice daily, do not stop taking it just because you feel better. Monitor for any adverse reactions. Do not take it within two hours of milk consumption, and avoid multivitamins while on the antibiotic. Please avoid excessive sun exposure or tanning beds while taking. Drink plenty of water.    Follow up with PCP or eye specialist if symptoms persist or worsen.

## 2021-08-20 NOTE — ED Provider Notes (Signed)
[facility]    CSN: [id number] Arrival date & time: 08/20/21  1023      History   Chief Complaint Chief Complaint  Patient presents with   Facial Swelling    HPI Richard Macdonald is a 76 y.o. male.   Pleasant 76 year old male presents today with concern of left lower eyelid swelling.  He states that there is some redness to his lower lid and cheek over the past 3 days, which started becoming slightly painful when touched.  He woke up this morning and the lower eyelid was also swollen.  He denies any symptoms in his actual eyeball.  He denies any upper eyelid concerns.  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  He denies any vision change, photophobia, headache.  He denies any fever.  He denies any pain with extraocular movements.    Past Medical History:  Diagnosis Date   Allergy    Deafness in right ear    Hearing aids since 2016   Family history of prostate cancer in father    Hepatitis A 1968   Olecranon bursitis, right elbow 2021   TIA (transient ischemic attack) 12/2012   July.  Right thumb numbness and dysarthria.  Full w/u (EKG, echo, carotids, MRI brain) unrevealing; at the time was on no ASA, was put on 1/4 ASA qd after and has been fine.   Torn meniscus    R knee.  Dr. Oneal Grout.    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  2017 repeat showed left colon diverticulosis and internal hemorrhoids, otherwise normal.  No repeat recommended due to pt age.   [facility] Medications     Prior to Admission medications   Medication Sig Start Date End Date Taking? Authorizing Provider  doxycycline (VIBRAMYCIN) 100 MG capsule Take 1 capsule (100 mg total) by mouth 2 (two) times daily. 08/20/21  Yes Uchechukwu Dhawan L, PA  erythromycin ophthalmic ointment Place a 1/2 inch ribbon of ointment into the left lower eyelid three times daily x 5 days. 08/20/21  Yes Jeyden Coffelt L, PA  aspirin 81 MG tablet Take 81 mg by mouth daily.     [provider]    Family History Family History  Problem Relation Age of Onset   Prostate cancer Father    Breast cancer Sister    Colon cancer Neg Hx    Colon polyps Neg Hx    Esophageal cancer Neg Hx    Rectal cancer Neg Hx    Stomach cancer Neg Hx     Social History Social History   Tobacco Use   Smoking status: Former    Types: Cigarettes    Quit date: 07/04/1971    Years since quitting: 50.1   Smokeless tobacco: Never  Vaping Use   Vaping Use: Never used  Substance Use Topics   Alcohol use: Yes    Alcohol/week: 4.0 standard drinks    Types: 4 Glasses of wine per week  Comment: 3-4 days a week   Drug use: No     Allergies   Patient has no known allergies.   Review of Systems Review of Systems  Eyes:  Positive for redness.  All other systems reviewed and are negative.   Physical Exam Triage Vital Signs ED Triage Vitals  Enc Vitals Group     BP 08/20/21 1215 133/67     Pulse Rate 08/20/21 1215 65     Resp 08/20/21 1215 16     Temp 08/20/21 1215 98.1 F (36.7 C)     Temp Source 08/20/21 1215 Oral     SpO2 08/20/21 1215 99 %     Weight --      Height --      Head Circumference --      Peak Flow --      Pain Score 08/20/21 1214 0     Pain Loc --      Pain Edu? --      Excl. in [place]? --    No data found.  Updated Vital Signs BP 133/67 (BP Location: Left Arm)    Pulse 65    Temp 98.1 F (36.7 C) (Oral)    Resp 16    SpO2 99%   Visual Acuity Right Eye Distance:   Left Eye Distance:   Bilateral  Distance:    Right Eye Near:   Left Eye Near:    Bilateral Near:     Physical Exam Vitals and nursing note reviewed.  Constitutional:      General: He is not in acute distress.    Appearance: Normal appearance. He is well-developed and normal weight. He is not ill-appearing, toxic-appearing or diaphoretic.  HENT:     Head: Normocephalic and atraumatic.     Comments: Slight "puffiness" to L cheek/ nasolabial fold    Right Ear: Tympanic membrane, ear canal and external ear normal. There is no impacted cerumen.     Left Ear: Tympanic membrane, ear canal and external ear normal. There is no impacted cerumen.     Nose: Nose normal. No congestion or rhinorrhea.     Comments: Rhinophyma, mild    Mouth/Throat:     Mouth: Mucous membranes are moist.     Pharynx: Oropharynx is clear. No oropharyngeal exudate or posterior oropharyngeal erythema.  Eyes:     General: No scleral icterus.       Right eye: No discharge.        Left eye: No discharge.     Extraocular Movements: Extraocular movements intact.     Conjunctiva/sclera: Conjunctivae normal.     Pupils: Pupils are equal, round, and reactive to light.     Comments: Left lower eyelid mildly swollen, developing internal lower chalazion   Cardiovascular:     Rate and Rhythm: Normal rate and regular rhythm.  Pulmonary:     Effort: Pulmonary effort is normal. No respiratory distress.     Breath sounds: Normal breath sounds.  Abdominal:     Tenderness: There is no abdominal tenderness.  Musculoskeletal:        General: No swelling.     Cervical back: Normal range of motion and neck supple.  Lymphadenopathy:     Cervical: No cervical adenopathy.  Skin:    General: Skin is warm and dry.     Capillary Refill: Capillary refill takes less than 2 seconds.     Findings: Erythema (erythema/ telangiectasias to bilateral cheeks consistent with rosacea) present.  Neurological:  Mental Status: He is alert.  Psychiatric:        Mood and  Affect: Mood normal.     UC Treatments / Results  Labs (all labs ordered are listed, but only abnormal results are displayed) Labs Reviewed - No data to display  EKG   Radiology No results found.  Procedures Procedures (including critical care time)  Medications Ordered in UC Medications - No data to display  Initial Impression / Assessment and Plan / UC Course  I have reviewed the triage vital signs and the nursing notes.  Pertinent labs & imaging results that were available during my care of the patient were reviewed by me and considered in my medical decision making (see chart for details).     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 Limit hot beverages, ETOH. Follow up with PCP Chalazion - supportive measures key. Baby shampoo on washcloth for eye cleansing, will do emycin ointment for prophylaxis.  Final Clinical Impressions(s) / UC Diagnoses   Final diagnoses:  Ocular rosacea  Chalazion left lower eyelid     Discharge Instructions      Your symptoms are consistent with ocular rosacea in combination with a developing chalazion. Please take the doxycycline twice daily until gone. Use the erythromycin eye ointment in the eye 3 times daily x5 days. Use the yellow [employer] baby shampoo on a warm moist washcloth to help cleanse the eye.  Start taking the antibiotic twice daily, do not stop taking it just because you feel better. Monitor for any adverse reactions. Do not take it within two hours of milk consumption, and avoid multivitamins while on the antibiotic. Please avoid excessive sun exposure or tanning beds while taking. Drink plenty of water.    Follow up with PCP or eye specialist if symptoms persist or worsen.     ED Prescriptions     Medication Sig Dispense Auth. Provider   erythromycin ophthalmic ointment Place a 1/2 inch ribbon of ointment into the left lower eyelid three times daily x 5 days. 3.5 g Jaizon Deroos L, PA    doxycycline (VIBRAMYCIN) 100 MG capsule Take 1 capsule (100 mg total) by mouth 2 (two) times daily. 20 capsule Alvah Lagrow L, PA      PDMP not reviewed this encounter.   Chaney Malling, [place] 08/20/21 1237

## 2021-08-20 NOTE — ED Triage Notes (Signed)
Pt reports swelling under left eye for couple days. Denies drainage or visual problems.

## 2021-09-26 ENCOUNTER — Telehealth: Payer: Self-pay

## 2021-09-26 NOTE — Telephone Encounter (Signed)
Called pt to schedule AWV. Please schedule with health coach, Toria or Jasen Hartstein. ? ?

## 2021-11-04 DIAGNOSIS — Z23 Encounter for immunization: Secondary | ICD-10-CM | POA: Diagnosis not present

## 2021-12-21 ENCOUNTER — Telehealth: Payer: Self-pay

## 2021-12-21 NOTE — Telephone Encounter (Signed)
Called pt to schedule AWV. Please schedule with health coach, Toria or Ubaldo Daywalt. ? ?

## 2022-01-10 DIAGNOSIS — M11261 Other chondrocalcinosis, right knee: Secondary | ICD-10-CM | POA: Diagnosis not present

## 2022-01-10 DIAGNOSIS — M1712 Unilateral primary osteoarthritis, left knee: Secondary | ICD-10-CM | POA: Diagnosis not present

## 2022-01-10 DIAGNOSIS — M11262 Other chondrocalcinosis, left knee: Secondary | ICD-10-CM | POA: Diagnosis not present

## 2022-01-10 DIAGNOSIS — M170 Bilateral primary osteoarthritis of knee: Secondary | ICD-10-CM | POA: Diagnosis not present

## 2022-01-10 DIAGNOSIS — M25552 Pain in left hip: Secondary | ICD-10-CM | POA: Diagnosis not present

## 2022-02-02 ENCOUNTER — Encounter (INDEPENDENT_AMBULATORY_CARE_PROVIDER_SITE_OTHER): Payer: Medicare Other | Admitting: Ophthalmology

## 2022-02-09 ENCOUNTER — Encounter (INDEPENDENT_AMBULATORY_CARE_PROVIDER_SITE_OTHER): Payer: Self-pay | Admitting: Ophthalmology

## 2022-02-09 ENCOUNTER — Ambulatory Visit (INDEPENDENT_AMBULATORY_CARE_PROVIDER_SITE_OTHER): Payer: Medicare Other | Admitting: Ophthalmology

## 2022-02-09 ENCOUNTER — Encounter (INDEPENDENT_AMBULATORY_CARE_PROVIDER_SITE_OTHER): Payer: Medicare Other | Admitting: Ophthalmology

## 2022-02-09 DIAGNOSIS — H33312 Horseshoe tear of retina without detachment, left eye: Secondary | ICD-10-CM

## 2022-02-09 DIAGNOSIS — H43313 Vitreous membranes and strands, bilateral: Secondary | ICD-10-CM | POA: Insufficient documentation

## 2022-02-09 DIAGNOSIS — H33311 Horseshoe tear of retina without detachment, right eye: Secondary | ICD-10-CM

## 2022-02-09 DIAGNOSIS — H43813 Vitreous degeneration, bilateral: Secondary | ICD-10-CM | POA: Diagnosis not present

## 2022-02-09 DIAGNOSIS — Z961 Presence of intraocular lens: Secondary | ICD-10-CM | POA: Diagnosis not present

## 2022-02-09 NOTE — Assessment & Plan Note (Signed)
Stable OU 

## 2022-02-09 NOTE — Assessment & Plan Note (Signed)
Reviewed the symptoms that could trigger the need for surgical intervention, patient only with minimal symptoms prefers to completely observe

## 2022-02-09 NOTE — Assessment & Plan Note (Signed)
No new breaks °

## 2022-02-09 NOTE — Assessment & Plan Note (Signed)
Stable at this time 

## 2022-02-09 NOTE — Progress Notes (Signed)
02/09/2022     CHIEF COMPLAINT Patient presents for  Chief Complaint  Patient presents with   Retinal Break      HISTORY OF PRESENT ILLNESS: Richard Macdonald is a 76 y.o. male who presents to the clinic today for:   HPI   1 year fu ou fp  Pt states his vision has been stable Pt admits to floaters but no FOL   Last edited by Morene Rankins, CMA on 02/09/2022  1:06 PM.      Referring physician: Rutherford Guys, [place],  [place] [id number]  HISTORICAL INFORMATION:   Selected notes from the [facility]    Lab Results  Component Value Date   HGBA1C 5.5 11/05/2014     CURRENT MEDICATIONS: Current Outpatient Medications (Ophthalmic Drugs)  Medication Sig   erythromycin ophthalmic ointment Place a 1/2 inch ribbon of ointment into the left lower eyelid three times daily x 5 days.   No current facility-administered medications for this visit. (Ophthalmic Drugs)   Current Outpatient Medications (Other)  Medication Sig   aspirin 81 MG tablet Take 81 mg by mouth daily.    doxycycline (VIBRAMYCIN) 100 MG capsule Take 1 capsule (100 mg total) by mouth 2 (two) times daily.   No current facility-administered medications for this visit. (Other)      REVIEW OF SYSTEMS: ROS   Negative for: Constitutional, Gastrointestinal, Neurological, Skin, Genitourinary, Musculoskeletal, HENT, Endocrine, Cardiovascular, Eyes, Respiratory, Psychiatric, Allergic/Imm, Heme/Lymph Last edited by Orene Desanctis D, CMA on 02/09/2022  1:06 PM.       ALLERGIES No Known Allergies  PAST MEDICAL HISTORY Past Medical History:  Diagnosis Date   Allergy    Cataract, nuclear sclerotic, both eyes 07/12/2020   Deafness in right ear    Hearing aids since 2016   Family history of prostate cancer in father    Hepatitis A 1968   Olecranon bursitis, right elbow 2021   TIA (transient ischemic attack) 12/2012   July.  Right thumb numbness and dysarthria.  Full w/u (EKG,  echo, carotids, MRI brain) unrevealing; at the time was on no ASA, was put on 1/4 ASA qd after and has been fine.   Torn meniscus    R knee.  Dr. Oneal Grout.   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  2017 repeat showed left colon diverticulosis and internal hemorrhoids, otherwise normal.  No repeat recommended due to pt age.   HEMORRHOID SURGERY  1999    FAMILY HISTORY Family History  Problem Relation Age of Onset   Prostate cancer Father    Breast cancer Sister    Colon cancer Neg Hx    Colon polyps Neg Hx    Esophageal cancer Neg Hx    Rectal cancer Neg Hx    Stomach cancer Neg Hx     SOCIAL HISTORY Social History   Tobacco Use   Smoking status: Former    Types: Cigarettes    Quit date: 07/04/1971    Years since quitting: 50.6   Smokeless tobacco: Never  Vaping Use   Vaping Use: Never used  Substance Use Topics   Alcohol use: Yes    Alcohol/week: 4.0 standard drinks of alcohol    Types: 4 Glasses of wine per week    Comment: 3-4 days a week   Drug use: No  OPHTHALMIC EXAM:  Base Eye Exam     Visual Acuity (ETDRS)       Right Left   [place] 20/15 20/20 +3         Tonometry (Tonopen, 1:10 PM)       Right Left   Pressure 17 7         Pupils       Pupils Shape   Right PERRL Round   Left PERRL          Visual Fields       Left Right    Full          Extraocular Movement       Right Left    Ortho Ortho    -- -- --  --  --  -- -- --   -- -- --  --  --  -- -- --           Neuro/Psych     Oriented x3: Yes   Mood/Affect: Normal         Dilation     Both eyes: 1.0% Mydriacyl, 2.5% Phenylephrine @ 1:07 PM           Slit Lamp and Fundus Exam     External Exam       Right Left   External Normal Normal         Slit Lamp Exam       Right Left   Lids/Lashes Normal Normal   Conjunctiva/Sclera White and  quiet White and quiet   Cornea Clear Clear   Anterior Chamber Deep and quiet Deep and quiet   Iris Round and reactive Round and reactive   Lens Centered posterior chamber intraocular lens Centered posterior chamber intraocular lens   Anterior Vitreous Normal Normal         Fundus Exam       Right Left   Posterior Vitreous Posterior vitreous detachment, Central vitreous floaters Posterior vitreous detachment   Disc Normal Peripapillary atrophy   C/D Ratio 0.6 0.6   Macula Normal Normal   Vessels Normal Normal   Periphery good retinopexy Retinal tear at 11:30  Good pexy, , pigment at 7, poor detail view, pigment at 8, Retinal Tear at 1:00 good pexy, good laser at 12 Retinal tear at 12 o clock, good pexy, pigment at 6            IMAGING AND PROCEDURES  Imaging and Procedures for 02/09/22  Color Fundus Photography Optos - OU - Both Eyes       Right Eye Progression has been stable. Macula : normal observations. Vessels : normal observations.   Left Eye Progression has been stable. Disc findings include normal observations. Vessels : normal observations.   Notes Posterior vitreous detachment OS, with large central vitreous floater  OD, similar posterior vitreous detachment, small vitreous floater, no new retinal breaks                ASSESSMENT/PLAN:  Pseudophakia of both eyes Stable OU  Horseshoe retinal tear of left eye No new breaks  Horseshoe tear of retina of right eye without detachment No new breaks  Posterior vitreous detachment of both eyes Stable at this time  Vitreous membranes and strands, bilateral Reviewed the symptoms that could trigger the need for surgical intervention, patient only with minimal symptoms prefers to completely observe     ICD-10-CM   1. Posterior vitreous detachment of both eyes  H43.813 Color Fundus Photography Optos -  OU - Both Eyes    2. Pseudophakia of both eyes  Z96.1     3. Horseshoe retinal tear of left eye   H33.312     4. Horseshoe tear of retina of right eye without detachment  H33.311     5. Vitreous membranes and strands, bilateral  H43.313       1.  OU, history of retinal tears, stable no new breaks today  2.  Will continue to observe patient to follow-up promptly if new symptoms arise of  3.  Ophthalmic Meds Ordered this visit:  No orders of the defined types were placed in this encounter.      Return in about 1 year (around 02/10/2023) for DILATE OU, COLOR FP, OCT.  There are no Patient Instructions on file for this visit.   Explained the diagnoses, plan, and follow up with the patient and they expressed understanding.  Patient expressed understanding of the importance of proper follow up care.   Clent Demark Guthrie Lemme M.D. Diseases & Surgery of the Retina and Vitreous [facility] [place] 02/09/22     Abbreviations: M myopia (nearsighted); A astigmatism; H hyperopia (farsighted); P presbyopia; Mrx spectacle prescription;  CTL contact lenses; OD right eye; OS left eye; OU both eyes  XT exotropia; ET esotropia; PEK punctate epithelial keratitis; PEE punctate epithelial erosions; DES dry eye syndrome; MGD meibomian gland dysfunction; ATs artificial tears; PFAT's preservative free artificial tears; [place] nuclear sclerotic cataract; PSC posterior subcapsular cataract; ERM epi-retinal membrane; PVD posterior vitreous detachment; RD retinal detachment; DM diabetes mellitus; DR diabetic retinopathy; NPDR non-proliferative diabetic retinopathy; PDR proliferative diabetic retinopathy; CSME clinically significant macular edema; DME diabetic macular edema; dbh dot blot hemorrhages; CWS cotton wool spot; POAG primary open angle glaucoma; C/D cup-to-disc ratio; HVF humphrey visual field; GVF goldmann visual field; OCT optical coherence tomography; IOP intraocular pressure; BRVO Branch retinal vein occlusion; CRVO central retinal vein occlusion; CRAO central retinal artery occlusion; BRAO  branch retinal artery occlusion; RT retinal tear; SB scleral buckle; PPV pars plana vitrectomy; VH Vitreous hemorrhage; PRP panretinal laser photocoagulation; IVK intravitreal kenalog; VMT vitreomacular traction; MH Macular hole;  NVD neovascularization of the disc; NVE neovascularization elsewhere; AREDS age related eye disease study; ARMD age related macular degeneration; POAG primary open angle glaucoma; EBMD epithelial/anterior basement membrane dystrophy; ACIOL anterior chamber intraocular lens; IOL intraocular lens; PCIOL posterior chamber intraocular lens; Phaco/IOL phacoemulsification with intraocular lens placement; [place] photorefractive keratectomy; LASIK laser assisted in situ keratomileusis; HTN hypertension; DM diabetes mellitus; COPD chronic obstructive pulmonary disease

## 2022-02-14 ENCOUNTER — Telehealth: Payer: Self-pay | Admitting: Family Medicine

## 2022-02-14 NOTE — Telephone Encounter (Signed)
Left message for patient to schedule Annual Wellness Visit.  Please schedule (telephone/video call) with Nurse Health Advisor Tina Betterson, RN at [facility]. Please call [phone] ask for Kathy 

## 2022-02-20 ENCOUNTER — Telehealth: Payer: Self-pay

## 2022-02-20 DIAGNOSIS — Z0279 Encounter for issue of other medical certificate: Secondary | ICD-10-CM

## 2022-02-20 DIAGNOSIS — Z0183 Encounter for blood typing: Secondary | ICD-10-CM

## 2022-02-20 NOTE — Telephone Encounter (Addendum)
Form will be reviewed and will follow up patient after.   Placed on PCP desk to review and sign, if appropriate.

## 2022-02-20 NOTE — Telephone Encounter (Signed)
Type of forms received: General Medical Information for [facility] (departure 06/09/22)   Routed to:  [employer] [place]  Paperwork received by :  Hinton Dyer   Individual made aware of 3-5 business day turn around (Y/N): Yes   Form completed and patient made aware of charges(Y/N): yes  Last PCP OV:  04/23/2021 Patient made aware Dr. Anitra Lauth is out of office until 8/22; patient may have to schedule OV prior to provider completing form.   Form placed in [place] [facility] 02/20/22

## 2022-02-21 NOTE — Telephone Encounter (Signed)
Form missing 2 section completions. Placed on PCP desk to review

## 2022-02-21 NOTE — Telephone Encounter (Signed)
Signed and put in box to go up front. Signed:  Crissie Sickles, MD           02/21/2022

## 2022-02-22 NOTE — Telephone Encounter (Signed)
Patient made aware form completed. He will pick this up by the end of the week.

## 2022-02-22 NOTE — Telephone Encounter (Signed)
LM for patient regarding form completion. Form has been placed up front for pick up.

## 2022-02-22 NOTE — Telephone Encounter (Signed)
Signed and put in box to go up front. Signed:  Crissie Sickles, MD           02/22/2022

## 2022-03-07 NOTE — Telephone Encounter (Signed)
Patient picked up form last week.  His blood type was missing.  Travel agency told patient this must be verified for trip in December. Patient has no idea what his blood type is.  Can Dr. Anitra Lauth place order in for lab work to verify his blood typed? Patient last visit with Dr. Anitra Lauth 04/12/21.  Will this require an appt with PCP?  Please call patient, may leave detailed message.  [phone]

## 2022-03-07 NOTE — Telephone Encounter (Signed)
Please confirm blood type testing not completed here.

## 2022-03-07 NOTE — Telephone Encounter (Signed)
Patient scheduled for lab visit

## 2022-03-07 NOTE — Telephone Encounter (Signed)
I have never heard of this requirement. Unfortunately, we are unable to do blood type testing in our lab or any outpatient lab that I know of (?check with [facility]). As far as I know this is only done at blood donation centers or if a person is in the hospital and going to receive the blood transfusion.

## 2022-03-07 NOTE — Telephone Encounter (Signed)
Confirmed with [facility], they do this type of testing. Lab pending

## 2022-03-07 NOTE — Telephone Encounter (Signed)
OK, order signed. thx

## 2022-03-08 ENCOUNTER — Other Ambulatory Visit: Payer: Medicare Other

## 2022-03-08 DIAGNOSIS — Z0183 Encounter for blood typing: Secondary | ICD-10-CM

## 2022-03-09 LAB — ABO/RH: Rh Factor: POSITIVE

## 2022-03-10 ENCOUNTER — Ambulatory Visit (INDEPENDENT_AMBULATORY_CARE_PROVIDER_SITE_OTHER): Payer: Medicare Other

## 2022-03-10 DIAGNOSIS — Z0000 Encounter for general adult medical examination without abnormal findings: Secondary | ICD-10-CM | POA: Diagnosis not present

## 2022-03-10 NOTE — Progress Notes (Signed)
Subjective:   Richard Macdonald is a 76 y.o. male who presents for an Initial Medicare Annual Wellness Visit.  I connected with  Richard Macdonald on 03/10/22 by an audio only telemedicine application and verified that I am speaking with the correct person using two identifiers.   I discussed the limitations, risks, security and privacy concerns of performing an evaluation and management service by telephone and the availability of in person appointments. I also discussed with the patient that there may be a patient responsible charge related to this service. The patient expressed understanding and verbally consented to this telephonic visit.  Location of Patient: home Location of Provider:Office  List any persons and their role that are participating in the visit with the patient.   [place], [place]  Review of Systems    Defer to PCP Cardiac Risk Factors include: advanced age (>89mn, >>50women);male gender     Objective:    There were no vitals filed for this visit. There is no height or weight on file to calculate BMI.     03/10/2022    4:14 PM 02/03/2016    8:37 AM 01/20/2016   11:08 AM  Advanced Directives  Does Patient Have a Medical Advance Directive? No No No    Current Medications (verified) Outpatient Encounter Medications as of 03/10/2022  Medication Sig   aspirin 81 MG tablet Take 81 mg by mouth daily.    doxycycline (VIBRAMYCIN) 100 MG capsule Take 1 capsule (100 mg total) by mouth 2 (two) times daily.   erythromycin ophthalmic ointment Place a 1/2 inch ribbon of ointment into the left lower eyelid three times daily x 5 days.   No facility-administered encounter medications on file as of 03/10/2022.    Allergies (verified) Patient has no known allergies.   History: Past Medical History:  Diagnosis Date   Allergy    Cataract, nuclear sclerotic, both eyes 07/12/2020   Deafness in right ear    Hearing aids since 2016   Family history of prostate  cancer in father    Hepatitis A 1968   Olecranon bursitis, right elbow 2021   TIA (transient ischemic attack) 12/2012   July.  Right thumb numbness and dysarthria.  Full w/u (EKG, echo, carotids, MRI brain) unrevealing; at the time was on no ASA, was put on 1/4 ASA qd after and has been fine.   Torn meniscus    R knee.  Dr. ROneal Grout   Past Surgical History:  Procedure Laterality Date   ACOUSTIC NEUROMA RESECTION  2000   Gamma knife   COLONOSCOPY  2007; 02/2016   Normal 2007.  2017 repeat showed left colon diverticulosis and internal hemorrhoids, otherwise normal.  No repeat recommended due to pt age.   HEMORRHOID SURGERY  1999   Family History  Problem Relation Age of Onset   Prostate cancer Father    Breast cancer Sister    Colon cancer Neg Hx    Colon polyps Neg Hx    Esophageal cancer Neg Hx    Rectal cancer Neg Hx    Stomach cancer Neg Hx    Social History   Socioeconomic History   Marital status: Married    Spouse name: Not on file   Number of children: Not on file   Years of education: Not on file   Highest education level: Not on file  Occupational History   Not on file  Tobacco Use   Smoking status: Former  Types: Cigarettes    Quit date: 07/04/1971    Years since quitting: 50.7   Smokeless tobacco: Never  Vaping Use   Vaping Use: Never used  Substance and Sexual Activity   Alcohol use: Yes    Alcohol/week: 4.0 standard drinks of alcohol    Types: 4 Glasses of wine per week    Comment: 3-4 days a week   Drug use: No   Sexual activity: Not on file  Other Topics Concern   Not on file  Social History Narrative   Married, 3 children in their 26s.   College educated.   Occupation: retired from [employer] (42 yrs).   Former smoker: 10 pack-yr hx, quit 1973.   Alcohol: 4-5 glasses of wine per week.   Exercises 3 days/week minimum x 1.5 hours; [employer], bike, hiking.   Social Determinants of Health   Financial Resource Strain: Low Risk   (03/10/2022)   Overall Financial Resource Strain (CARDIA)    Difficulty of Paying Living Expenses: Not hard at all  Food Insecurity: No Food Insecurity (03/10/2022)   Hunger Vital Sign    Worried About Running Out of Food in the Last Year: Never true    Ran Out of Food in the Last Year: Never true  Transportation Needs: No Transportation Needs (03/10/2022)   PRAPARE - Hydrologist (Medical): No    Lack of Transportation (Non-Medical): No  Physical Activity: Sufficiently Active (03/10/2022)   Exercise Vital Sign    Days of Exercise per Week: 4 days    Minutes of Exercise per Session: 90 min  Stress: No Stress Concern Present (03/10/2022)   [place]    Feeling of Stress : Not at all  Social Connections: Moderately Isolated (03/10/2022)   Social Connection and Isolation Panel [NHANES]    Frequency of Communication with Friends and Family: More than three times a week    Frequency of Social Gatherings with Friends and Family: Twice a week    Attends Religious Services: Never    Marine scientist or Organizations: No    Attends Music therapist: Never    Marital Status: Married    Tobacco Counseling Counseling given: Not Answered   Clinical Intake:  Pre-visit preparation completed: No  Pain : No/denies pain     Nutritional Status: BMI 25 -29 Overweight Nutritional Risks: None Diabetes: No  How often do you need to have someone help you when you read instructions, pamphlets, or other written materials from your doctor or pharmacy?: 1 - Never  Diabetic?no  Interpreter Needed?: No      Activities of Daily Living    03/10/2022    3:56 PM  In your present state of health, do you have any difficulty performing the following activities:  Hearing? 1  Comment Pt has hearing aids  Vision? 0  Difficulty concentrating or making decisions? 0  Walking or climbing stairs? 0   Dressing or bathing? 0  Doing errands, shopping? 0  Preparing Food and eating ? N  Using the Toilet? N  In the past six months, have you accidently leaked urine? N  Do you have problems with loss of bowel control? N  Managing your Medications? N  Managing your Finances? N  Housekeeping or managing your Housekeeping? N    Patient Care Team: Tammi Sou, MD as PCP - General (Family Medicine) Loletha Carrow Kirke Corin, MD as Consulting Physician (Gastroenterology)  Ocie Bob, MD as Referring Physician (Internal Medicine) Frederik Pear, MD as Consulting Physician (Orthopedic Surgery)  Indicate any recent Medical Services you may have received from other than [facility] providers in the past year (date may be approximate).     Assessment:   This is a routine wellness examination for [place].  Hearing/Vision screen No results found.  Dietary issues and exercise activities discussed: Current Exercise Habits: Structured exercise class, Time (Minutes): > 60, Frequency (Times/Week): 4, Weekly Exercise (Minutes/Week): 0, Intensity: Moderate   Goals Addressed   None   Depression Screen    03/10/2022    3:56 PM 04/12/2021    9:47 AM 10/16/2019    8:55 AM 11/17/2015    9:23 AM 11/03/2014    9:26 AM  PHQ 2/9 Scores  PHQ - 2 Score 0 0 0 0 0    Fall Risk    03/10/2022    3:56 PM 04/12/2021    9:47 AM 05/23/2019    9:34 AM 05/21/2018    4:10 PM 01/22/2017    9:25 AM  Fall Risk   Falls in the past year? 0 0 0 0 No  Comment   [facility] Telephone Survey: data to providers prior to load [employer] Survey: data to providers prior to load [employer] Survey: data to providers prior to load  Number falls in past yr: 0 0     Injury with Fall? 0 0     Risk for fall due to : No Fall Risks      Follow up Falls evaluation completed Falls evaluation completed       [place]:  Any stairs in or around the home? Yes  If so, are there any without handrails? Yes   Home free of loose throw rugs in walkways, pet beds, electrical cords, etc? Yes  Adequate lighting in your home to reduce risk of falls? Yes   ASSISTIVE DEVICES UTILIZED TO PREVENT FALLS:  Life alert? No  Use of a cane, walker or w/c? No  Grab bars in the bathroom? Yes  Shower chair or bench in shower? No  Elevated toilet seat or a handicapped toilet? No   TIMED UP AND GO:  Was the test performed? No .  Length of time to ambulate 10 feet: n/a sec.     Cognitive Function:        03/10/2022    4:14 PM  6CIT Screen  What Year? 0 points  What month? 0 points  What time? 0 points  Count back from 20 0 points  Months in reverse 0 points  Repeat phrase 0 points  Total Score 0 points    Immunizations Immunization History  Administered Date(s) Administered   Hepatitis B 07/03/2010   Influenza, High Dose Seasonal PF 04/29/2018   Influenza,inj,Quad PF,6+ Mos 04/02/2014   PFIZER(Purple Top)SARS-COV-2 Vaccination 07/23/2019, 08/11/2019, 04/27/2020, 12/03/2020   Pfizer Covid-19 Vaccine Bivalent Booster 25yr & up 04/05/2021   Pneumococcal Conjugate-13 07/03/2012   Pneumococcal-Unspecified 07/03/2013   Tdap 07/03/2010   Zoster Recombinat (Shingrix) 10/27/2019, 02/06/2020   Zoster, Live 07/03/2010    TDAP status: Up to date  Flu Vaccine status: Due, Education has been provided regarding the importance of this vaccine. Advised may receive this vaccine at local pharmacy or Health Dept. Aware to provide a copy of the vaccination record if obtained from local pharmacy or Health Dept. Verbalized acceptance and understanding.  Pneumococcal vaccine status: Due, Education has been provided regarding the importance  of this vaccine. Advised may receive this vaccine at local pharmacy or Health Dept. Aware to provide a copy of the vaccination record if obtained from local pharmacy or Health Dept. Verbalized acceptance and understanding.  Covid-19 vaccine status: Completed  vaccines  Qualifies for Shingles Vaccine? Yes   Zostavax completed No   Shingrix Completed?: Yes  Screening Tests Health Maintenance  Topic Date Due   Pneumonia Vaccine 24+ Years old (2 - PPSV23 or PCV20) 07/03/2013   TETANUS/TDAP  07/03/2020   COVID-19 Vaccine (6 - Pfizer series) 08/06/2021   INFLUENZA VACCINE  01/31/2022   Hepatitis C Screening  Completed   Zoster Vaccines- Shingrix  Completed   HPV VACCINES  Aged Out   COLONOSCOPY (Pts 45-49yr Insurance coverage will need to be confirmed)  Discontinued    Health Maintenance  Health Maintenance Due  Topic Date Due   Pneumonia Vaccine 76 Years old (2 - PPSV23 or PCV20) 07/03/2013   TETANUS/TDAP  07/03/2020   COVID-19 Vaccine (6 - Pfizer series) 08/06/2021   INFLUENZA VACCINE  01/31/2022    Colorectal cancer screening: No longer required.   Lung Cancer Screening: (Low Dose CT Chest recommended if Age 76-80years, 30 pack-year currently smoking OR have quit w/in 15years.) does not qualify.   Lung Cancer Screening Referral: N/A  Additional Screening:  Hepatitis C Screening: does qualify; Completed 11/17/2015  Vision Screening: Recommended annual ophthalmology exams for early detection of glaucoma and other disorders of the eye. Is the patient up to date with their annual eye exam?  Yes  Who is the provider or what is the name of the office in which the patient attends annual eye exams? [facility]If pt is not established with a provider, would they like to be referred to a provider to establish care? No .   Dental Screening: Recommended annual dental exams for proper oral hygiene  Community Resource Referral / Chronic Care Management: CRR required this visit?  No   CCM required this visit?  No      Plan:     I have personally reviewed and noted the following in the patient's chart:   Medical and social history Use of alcohol, tobacco or illicit drugs  Current medications and supplements including  opioid prescriptions. Patient is not currently taking opioid prescriptions. Functional ability and status Nutritional status Physical activity Advanced directives List of other physicians Hospitalizations, surgeries, and ER visits in previous 12 months Vitals Screenings to include cognitive, depression, and falls Referrals and appointments  In addition, I have reviewed and discussed with patient certain preventive protocols, quality metrics, and best practice recommendations. A written personalized care plan for preventive services as well as general preventive health recommendations were provided to patient.     VOctaviano Glow CMA   03/10/2022   Nurse Notes: Non-Face to Face or Face to Face 8 minute visit Encounter   Mr. SLinus Mako, Thank you for taking time to come for your Medicare Wellness Visit. I appreciate your ongoing commitment to your health goals. Please review the following plan we discussed and let me know if I can assist you in the future.   These are the goals we discussed:  Goals   None     This is a list of the screening recommended for you and due dates:  Health Maintenance  Topic Date Due   Pneumonia Vaccine (2 - PPSV23 or PCV20) 07/03/2013   COVID-19 Vaccine (6 - Pfizer series) 08/06/2021   Flu Shot  01/31/2022  Tetanus Vaccine  12/27/2031   Hepatitis C Screening: USPSTF Recommendation to screen - Ages 58-79 yo.  Completed   Zoster (Shingles) Vaccine  Completed   HPV Vaccine  Aged Out   Colon Cancer Screening  Discontinued

## 2022-03-10 NOTE — Patient Instructions (Signed)
Health Maintenance, Male Adopting a healthy lifestyle and getting preventive care are important in promoting health and wellness. Ask your health care provider about: The right schedule for you to have regular tests and exams. Things you can do on your own to prevent diseases and keep yourself healthy. What should I know about diet, weight, and exercise? Eat a healthy diet  Eat a diet that includes plenty of vegetables, fruits, low-fat dairy products, and lean protein. Do not eat a lot of foods that are high in solid fats, added sugars, or sodium. Maintain a healthy weight Body mass index (BMI) is a measurement that can be used to identify possible weight problems. It estimates body fat based on height and weight. Your health care provider can help determine your BMI and help you achieve or maintain a healthy weight. Get regular exercise Get regular exercise. This is one of the most important things you can do for your health. Most adults should: Exercise for at least 150 minutes each week. The exercise should increase your heart rate and make you sweat (moderate-intensity exercise). Do strengthening exercises at least twice a week. This is in addition to the moderate-intensity exercise. Spend less time sitting. Even light physical activity can be beneficial. Watch cholesterol and blood lipids Have your blood tested for lipids and cholesterol at 76 years of age, then have this test every 5 years. You may need to have your cholesterol levels checked more often if: Your lipid or cholesterol levels are high. You are older than 76 years of age. You are at high risk for heart disease. What should I know about cancer screening? Many types of cancers can be detected early and may often be prevented. Depending on your health history and family history, you may need to have cancer screening at various ages. This may include screening for: Colorectal cancer. Prostate cancer. Skin cancer. Lung  cancer. What should I know about heart disease, diabetes, and high blood pressure? Blood pressure and heart disease High blood pressure causes heart disease and increases the risk of stroke. This is more likely to develop in people who have high blood pressure readings or are overweight. Talk with your health care provider about your target blood pressure readings. Have your blood pressure checked: Every 3-5 years if you are 18-39 years of age. Every year if you are 40 years old or older. If you are between the ages of 65 and 75 and are a current or former smoker, ask your health care provider if you should have a one-time screening for abdominal aortic aneurysm (AAA). Diabetes Have regular diabetes screenings. This checks your fasting blood sugar level. Have the screening done: Once every three years after age 45 if you are at a normal weight and have a low risk for diabetes. More often and at a younger age if you are overweight or have a high risk for diabetes. What should I know about preventing infection? Hepatitis B If you have a higher risk for hepatitis B, you should be screened for this virus. Talk with your health care provider to find out if you are at risk for hepatitis B infection. Hepatitis C Blood testing is recommended for: Everyone born from 1945 through 1965. Anyone with known risk factors for hepatitis C. Sexually transmitted infections (STIs) You should be screened each year for STIs, including gonorrhea and chlamydia, if: You are sexually active and are younger than 76 years of age. You are older than 76 years of age and your   health care provider tells you that you are at risk for this type of infection. Your sexual activity has changed since you were last screened, and you are at increased risk for chlamydia or gonorrhea. Ask your health care provider if you are at risk. Ask your health care provider about whether you are at high risk for HIV. Your health care provider  may recommend a prescription medicine to help prevent HIV infection. If you choose to take medicine to prevent HIV, you should first get tested for HIV. You should then be tested every 3 months for as long as you are taking the medicine. Follow these instructions at home: Alcohol use Do not drink alcohol if your health care provider tells you not to drink. If you drink alcohol: Limit how much you have to 0-2 drinks a day. Know how much alcohol is in your drink. In the [place] one drink equals one 12 oz bottle of beer (355 mL), one 5 oz glass of wine (148 mL), or one 1 oz glass of hard liquor (44 mL). Lifestyle Do not use any products that contain nicotine or tobacco. These products include cigarettes, chewing tobacco, and vaping devices, such as e-cigarettes. If you need help quitting, ask your health care provider. Do not use street drugs. Do not share needles. Ask your health care provider for help if you need support or information about quitting drugs. General instructions Schedule regular health, dental, and eye exams. Stay current with your vaccines. Tell your health care provider if: You often feel depressed. You have ever been abused or do not feel safe at home. Summary Adopting a healthy lifestyle and getting preventive care are important in promoting health and wellness. Follow your health care provider's instructions about healthy diet, exercising, and getting tested or screened for diseases. Follow your health care provider's instructions on monitoring your cholesterol and blood pressure. This information is not intended to replace advice given to you by your health care provider. Make sure you discuss any questions you have with your health care provider. Document Revised: 11/08/2020 Document Reviewed: 11/08/2020 Elsevier Patient Education  2023 Elsevier Inc.  

## 2022-03-27 ENCOUNTER — Other Ambulatory Visit (HOSPITAL_BASED_OUTPATIENT_CLINIC_OR_DEPARTMENT_OTHER): Payer: Self-pay

## 2022-03-27 DIAGNOSIS — Z23 Encounter for immunization: Secondary | ICD-10-CM | POA: Diagnosis not present

## 2022-03-27 MED ORDER — INFLUENZA VAC A&B SA ADJ QUAD 0.5 ML IM PRSY
PREFILLED_SYRINGE | INTRAMUSCULAR | 0 refills | Status: DC
  Filled 2022-03-27: qty 0.5, 1d supply, fill #0

## 2022-05-12 NOTE — Patient Instructions (Signed)
Health Maintenance, Male Adopting a healthy lifestyle and getting preventive care are important in promoting health and wellness. Ask your health care provider about: The right schedule for you to have regular tests and exams. Things you can do on your own to prevent diseases and keep yourself healthy. What should I know about diet, weight, and exercise? Eat a healthy diet  Eat a diet that includes plenty of vegetables, fruits, low-fat dairy products, and lean protein. Do not eat a lot of foods that are high in solid fats, added sugars, or sodium. Maintain a healthy weight Body mass index (BMI) is a measurement that can be used to identify possible weight problems. It estimates body fat based on height and weight. Your health care provider can help determine your BMI and help you achieve or maintain a healthy weight. Get regular exercise Get regular exercise. This is one of the most important things you can do for your health. Most adults should: Exercise for at least 150 minutes each week. The exercise should increase your heart rate and make you sweat (moderate-intensity exercise). Do strengthening exercises at least twice a week. This is in addition to the moderate-intensity exercise. Spend less time sitting. Even light physical activity can be beneficial. Watch cholesterol and blood lipids Have your blood tested for lipids and cholesterol at 76 years of age, then have this test every 5 years. You may need to have your cholesterol levels checked more often if: Your lipid or cholesterol levels are high. You are older than 76 years of age. You are at high risk for heart disease. What should I know about cancer screening? Many types of cancers can be detected early and may often be prevented. Depending on your health history and family history, you may need to have cancer screening at various ages. This may include screening for: Colorectal cancer. Prostate cancer. Skin cancer. Lung  cancer. What should I know about heart disease, diabetes, and high blood pressure? Blood pressure and heart disease High blood pressure causes heart disease and increases the risk of stroke. This is more likely to develop in people who have high blood pressure readings or are overweight. Talk with your health care provider about your target blood pressure readings. Have your blood pressure checked: Every 3-5 years if you are 18-39 years of age. Every year if you are 40 years old or older. If you are between the ages of 65 and 75 and are a current or former smoker, ask your health care provider if you should have a one-time screening for abdominal aortic aneurysm (AAA). Diabetes Have regular diabetes screenings. This checks your fasting blood sugar level. Have the screening done: Once every three years after age 45 if you are at a normal weight and have a low risk for diabetes. More often and at a younger age if you are overweight or have a high risk for diabetes. What should I know about preventing infection? Hepatitis B If you have a higher risk for hepatitis B, you should be screened for this virus. Talk with your health care provider to find out if you are at risk for hepatitis B infection. Hepatitis C Blood testing is recommended for: Everyone born from 1945 through 1965. Anyone with known risk factors for hepatitis C. Sexually transmitted infections (STIs) You should be screened each year for STIs, including gonorrhea and chlamydia, if: You are sexually active and are younger than 76 years of age. You are older than 76 years of age and your   health care provider tells you that you are at risk for this type of infection. Your sexual activity has changed since you were last screened, and you are at increased risk for chlamydia or gonorrhea. Ask your health care provider if you are at risk. Ask your health care provider about whether you are at high risk for HIV. Your health care provider  may recommend a prescription medicine to help prevent HIV infection. If you choose to take medicine to prevent HIV, you should first get tested for HIV. You should then be tested every 3 months for as long as you are taking the medicine. Follow these instructions at home: Alcohol use Do not drink alcohol if your health care provider tells you not to drink. If you drink alcohol: Limit how much you have to 0-2 drinks a day. Know how much alcohol is in your drink. In the [place] one drink equals one 12 oz bottle of beer (355 mL), one 5 oz glass of wine (148 mL), or one 1 oz glass of hard liquor (44 mL). Lifestyle Do not use any products that contain nicotine or tobacco. These products include cigarettes, chewing tobacco, and vaping devices, such as e-cigarettes. If you need help quitting, ask your health care provider. Do not use street drugs. Do not share needles. Ask your health care provider for help if you need support or information about quitting drugs. General instructions Schedule regular health, dental, and eye exams. Stay current with your vaccines. Tell your health care provider if: You often feel depressed. You have ever been abused or do not feel safe at home. Summary Adopting a healthy lifestyle and getting preventive care are important in promoting health and wellness. Follow your health care provider's instructions about healthy diet, exercising, and getting tested or screened for diseases. Follow your health care provider's instructions on monitoring your cholesterol and blood pressure. This information is not intended to replace advice given to you by your health care provider. Make sure you discuss any questions you have with your health care provider. Document Revised: 11/08/2020 Document Reviewed: 11/08/2020 Elsevier Patient Education  2023 Elsevier Inc.  

## 2022-05-15 ENCOUNTER — Encounter: Payer: Self-pay | Admitting: Family Medicine

## 2022-05-15 ENCOUNTER — Ambulatory Visit (INDEPENDENT_AMBULATORY_CARE_PROVIDER_SITE_OTHER): Payer: Medicare Other | Admitting: Family Medicine

## 2022-05-15 ENCOUNTER — Telehealth: Payer: Self-pay

## 2022-05-15 VITALS — BP 103/62 | HR 57 | Temp 98.5°F | Ht 71.0 in | Wt 176.4 lb

## 2022-05-15 DIAGNOSIS — Z125 Encounter for screening for malignant neoplasm of prostate: Secondary | ICD-10-CM

## 2022-05-15 DIAGNOSIS — Z8042 Family history of malignant neoplasm of prostate: Secondary | ICD-10-CM | POA: Diagnosis not present

## 2022-05-15 DIAGNOSIS — R972 Elevated prostate specific antigen [PSA]: Secondary | ICD-10-CM | POA: Diagnosis not present

## 2022-05-15 DIAGNOSIS — Z8673 Personal history of transient ischemic attack (TIA), and cerebral infarction without residual deficits: Secondary | ICD-10-CM

## 2022-05-15 DIAGNOSIS — Z0000 Encounter for general adult medical examination without abnormal findings: Secondary | ICD-10-CM

## 2022-05-15 LAB — COMPREHENSIVE METABOLIC PANEL
ALT: 12 U/L (ref 0–53)
AST: 14 U/L (ref 0–37)
Albumin: 4.2 g/dL (ref 3.5–5.2)
Alkaline Phosphatase: 52 U/L (ref 39–117)
BUN: 16 mg/dL (ref 6–23)
CO2: 30 mEq/L (ref 19–32)
Calcium: 8.9 mg/dL (ref 8.4–10.5)
Chloride: 106 mEq/L (ref 96–112)
Creatinine, Ser: 1.02 mg/dL (ref 0.40–1.50)
GFR: 71.3 mL/min (ref >60.00)
Glucose, Bld: 98 mg/dL (ref 70–99)
Potassium: 4.6 mEq/L (ref 3.5–5.1)
Sodium: 142 mEq/L (ref 135–145)
Total Bilirubin: 0.7 mg/dL (ref 0.2–1.2)
Total Protein: 6.9 g/dL (ref 6.0–8.3)

## 2022-05-15 LAB — CBC
HCT: 43 % (ref 39.0–52.0)
Hemoglobin: 14.1 g/dL (ref 13.0–17.0)
MCHC: 32.9 g/dL (ref 30.0–36.0)
MCV: 91.6 fl (ref 78.0–100.0)
Platelets: 177 10*3/uL (ref 150.0–400.0)
RBC: 4.7 Mil/uL (ref 4.22–5.81)
RDW: 13.6 % (ref 11.5–15.5)
WBC: 4.8 10*3/uL (ref 4.0–10.5)

## 2022-05-15 LAB — PSA, MEDICARE: PSA: 5.63 ng/ml — ABNORMAL HIGH (ref 0.10–4.00)

## 2022-05-15 MED ORDER — SILDENAFIL CITRATE 100 MG PO TABS: 50.0000 mg | ORAL_TABLET | Freq: Every day | ORAL | 11 refills | Status: DC | PRN

## 2022-05-15 NOTE — Progress Notes (Addendum)
OFFICE VISIT  05/15/2022  CC:  Chief Complaint  Patient presents with   Annual Exam    Pt is fasting   HPI:    Patient is a 76 y.o. male who presents for annual f/u for remote hx of TIA, FH prostate cancer, and review of health maintenance needs.  A/P as of last visit: "#1 history of TIA and mild hyperlipidemia (LDL 120 in 2016). His last fasting lipid panel was in 2018 so we will repeat this today. Continue aspirin 81 mg a day.   1) Health maintenance: Reviewed age and gender appropriate health maintenance issues (prudent diet, regular exercise, health risks of tobacco and excessive alcohol, use of seatbelts, fire alarms in home, use of sunscreen).  Also reviewed age and gender appropriate health screening as well as vaccine recommendations. Vaccines: Tdap->rx sent to pharmacy. Flu->pt to get this later this week.  Shingrix->UTD.  Otherwise ALL UTD. Labs: cbc w/diff, cmet, flp, psa. Prostate ca screening: PSA. Colon ca screening: 2017 repeat showed left colon diverticulosis and internal hemorrhoids, otherwise normal.  No repeat recommended due to pt age"  INTERIM HX: Feeling well.  Taking aspirin 81 mg daily. Working out regularly, especially yoga lately. He is getting ready to go on a trip to [place].  Reports erectile dysfunction, requests medication. Has never taken 1 before for this.  ROS:  no fevers, no CP, no SOB, no wheezing, no cough, no dizziness, no HAs, no rashes, no melena/hematochezia.  No polyuria or polydipsia.  No myalgias or arthralgias.  No focal weakness, paresthesias, or tremors.  No acute vision or hearing abnormalities.  No dysuria or unusual/new urinary urgency or frequency.  No recent changes in lower legs. No n/v/d or abd pain.  No palpitations.    Past Medical History:  Diagnosis Date   Allergy    Cataract, nuclear sclerotic, both eyes 07/12/2020   Deafness in right ear    Hearing aids since 2016   Family history of prostate cancer in father     Hepatitis A 1968   Olecranon bursitis, right elbow 2021   TIA (transient ischemic attack) 12/2012   July.  Right thumb numbness and dysarthria.  Full w/u (EKG, echo, carotids, MRI brain) unrevealing; at the time was on no ASA, was put on 1/4 ASA qd after and has been fine.   Torn meniscus    R knee.  Dr. Oneal Grout.    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  2017 repeat showed left colon diverticulosis and internal hemorrhoids, otherwise normal.  No repeat recommended due to pt age.   [facility]    Outpatient Medications Prior to Visit  Medication Sig Dispense Refill   aspirin 81 MG tablet Take 81 mg by mouth daily.      influenza vaccine adjuvanted (FLUAD) 0.5 ML injection Inject into the muscle. 0.5 mL 0   doxycycline (VIBRAMYCIN) 100 MG capsule Take 1 capsule (100 mg total) by mouth 2 (two) times daily. 20 capsule 0   erythromycin ophthalmic ointment Place a 1/2 inch ribbon of ointment into the left lower eyelid three times daily x 5 days. 3.5 g 0   No facility-administered medications prior to visit.    No Known Allergies  ROS As per HPI  PE:    05/15/2022    8:34 AM 08/20/2021   12:15 PM 04/12/2021    9:42  AM  Vitals with BMI  Height '5\' 11"'$   '5\' 10"'$   Weight 176 lbs 6 oz  181 lbs 6 oz  BMI 93.57  01.77  Systolic 939 030 092  Diastolic 62 67 69  Pulse 57 65 58     Physical Exam  Gen: Alert, well appearing.  Patient is oriented to person, place, time, and situation. AFFECT: pleasant, lucid thought and speech. ENT: Ears: EACs clear, normal epithelium.  TMs with good light reflex and landmarks bilaterally.  Eyes: no injection, icteris, swelling, or exudate.  EOMI, PERRLA. Nose: no drainage or turbinate edema/swelling.  No injection or focal lesion.  Mouth: lips without lesion/swelling.  Oral mucosa pink and moist.  Dentition intact and without obvious  caries or gingival swelling.  Oropharynx without erythema, exudate, or swelling.  Neck: supple/nontender.  No LAD, mass, or TM.  Carotid pulses 2+ bilaterally, without bruits. CV: RRR, no m/r/g.   LUNGS: CTA bilat, nonlabored resps, good aeration in all lung fields. ABD: soft, NT, ND, BS normal.  No hepatospenomegaly or mass.  No bruits. EXT: no clubbing, cyanosis, or edema.  Musculoskeletal: no joint swelling, erythema, warmth, or tenderness.  ROM of all joints intact. Skin - no sores or suspicious lesions or rashes or color changes  LABS:  Last CBC Lab Results  Component Value Date   WBC 4.8 04/12/2021   HGB 13.9 04/12/2021   HCT 42.2 04/12/2021   MCV 91.3 04/12/2021   MCH 30.3 11/05/2019   RDW 14.4 04/12/2021   PLT 169.0 33/00/7622   Last metabolic panel Lab Results  Component Value Date   GLUCOSE 90 04/12/2021   NA 140 04/12/2021   K 4.1 04/12/2021   CL 105 04/12/2021   CO2 29 04/12/2021   BUN 15 04/12/2021   CREATININE 1.03 04/12/2021   GFRNONAA >60 11/05/2019   CALCIUM 8.9 04/12/2021   PROT 6.8 04/12/2021   ALBUMIN 4.2 04/12/2021   BILITOT 0.8 04/12/2021   ALKPHOS 58 04/12/2021   AST 17 04/12/2021   ALT 17 04/12/2021   ANIONGAP 9 11/05/2019   Last lipids Lab Results  Component Value Date   CHOL 147 04/12/2021   HDL 41.30 04/12/2021   LDLCALC 88 04/12/2021   TRIG 87.0 04/12/2021   CHOLHDL 4 04/12/2021   Last hemoglobin A1c Lab Results  Component Value Date   HGBA1C 5.5 11/05/2014   Lab Results  Component Value Date   PSA 3.02 04/12/2021   PSA 2.71 10/16/2019   PSA 3.12 08/05/2018   IMPRESSION AND PLAN:  #1 history of TIA. Doing well on aspirin daily.  #2 family history of prostate cancer.  His annual PSAs have been normal and stable. PSA today.  #3  Erectile dysfunction. Trial of Viagra 50 to 100 mg daily as needed.  #4 Preventative health care: Vaccines: Flu->UTD.  Prevnar 20-->declined. Labs: cbc,cmet,flp, psa Prostate ca screening:  PSA Colon ca screening: 2017 repeat showed left colon diverticulosis and internal hemorrhoids, otherwise normal.  No repeat recommended due to pt age  An After Visit Summary was printed and given to the patient.  FOLLOW UP: Return in about 1 year (around 05/16/2023) for annual CPE (fasting).  Signed:  Crissie Sickles, MD           05/15/2022

## 2022-05-15 NOTE — Addendum Note (Signed)
Addended by: Tammi Sou on: 05/15/2022 09:25 AM   Modules accepted: Orders

## 2022-05-15 NOTE — Telephone Encounter (Signed)
LM for pt to return call. Pharmacy sent fax stating current medication, Sildenafil is not covered by his insurance. Need to confirm if patient has picked up the prescription already or okay with alternative.   Alternatives for provider to consider: - Tadalafil tab - Vardenafil tab

## 2022-05-16 NOTE — Addendum Note (Signed)
Addended by: Beatrix Fetters on: 05/16/2022 01:37 PM   Modules accepted: Orders

## 2022-05-16 NOTE — Telephone Encounter (Signed)
Spoke with pt, pharmacy was able to find discount and pt was able to pick up the medication. Nothing further needed.

## 2022-05-29 ENCOUNTER — Other Ambulatory Visit (HOSPITAL_BASED_OUTPATIENT_CLINIC_OR_DEPARTMENT_OTHER): Payer: Self-pay

## 2022-05-29 MED ORDER — AREXVY 120 MCG/0.5ML IM SUSR
INTRAMUSCULAR | 0 refills | Status: AC
  Filled 2022-05-29: qty 0.5, 1d supply, fill #0

## 2022-06-28 DIAGNOSIS — R972 Elevated prostate specific antigen [PSA]: Secondary | ICD-10-CM | POA: Diagnosis not present

## 2022-06-28 DIAGNOSIS — N401 Enlarged prostate with lower urinary tract symptoms: Secondary | ICD-10-CM | POA: Diagnosis not present

## 2022-06-28 DIAGNOSIS — R3915 Urgency of urination: Secondary | ICD-10-CM | POA: Diagnosis not present

## 2022-10-12 DIAGNOSIS — R3915 Urgency of urination: Secondary | ICD-10-CM | POA: Diagnosis not present

## 2022-10-12 DIAGNOSIS — N401 Enlarged prostate with lower urinary tract symptoms: Secondary | ICD-10-CM | POA: Diagnosis not present

## 2022-10-12 DIAGNOSIS — R972 Elevated prostate specific antigen [PSA]: Secondary | ICD-10-CM | POA: Diagnosis not present

## 2022-11-28 ENCOUNTER — Encounter: Payer: Self-pay | Admitting: Family Medicine

## 2023-02-15 ENCOUNTER — Encounter (INDEPENDENT_AMBULATORY_CARE_PROVIDER_SITE_OTHER): Payer: Medicare Other | Admitting: Ophthalmology

## 2023-02-15 DIAGNOSIS — H43813 Vitreous degeneration, bilateral: Secondary | ICD-10-CM | POA: Diagnosis not present

## 2023-02-15 DIAGNOSIS — H43313 Vitreous membranes and strands, bilateral: Secondary | ICD-10-CM | POA: Diagnosis not present

## 2023-02-15 DIAGNOSIS — H33313 Horseshoe tear of retina without detachment, bilateral: Secondary | ICD-10-CM | POA: Diagnosis not present

## 2023-03-23 ENCOUNTER — Other Ambulatory Visit (HOSPITAL_BASED_OUTPATIENT_CLINIC_OR_DEPARTMENT_OTHER): Payer: Self-pay

## 2023-03-23 DIAGNOSIS — Z23 Encounter for immunization: Secondary | ICD-10-CM | POA: Diagnosis not present

## 2023-03-23 MED ORDER — COVID-19 MRNA VAC-TRIS(PFIZER) 30 MCG/0.3ML IM SUSY
0.3000 mL | PREFILLED_SYRINGE | Freq: Once | INTRAMUSCULAR | 0 refills | Status: AC
  Filled 2023-03-23: qty 0.3, 1d supply, fill #0

## 2023-03-23 MED ORDER — INFLUENZA VAC A&B SURF ANT ADJ 0.5 ML IM SUSY
0.5000 mL | PREFILLED_SYRINGE | Freq: Once | INTRAMUSCULAR | 0 refills | Status: AC
  Filled 2023-03-23: qty 0.5, 1d supply, fill #0

## 2023-05-07 DIAGNOSIS — R972 Elevated prostate specific antigen [PSA]: Secondary | ICD-10-CM | POA: Diagnosis not present

## 2023-05-10 DIAGNOSIS — N5201 Erectile dysfunction due to arterial insufficiency: Secondary | ICD-10-CM | POA: Diagnosis not present

## 2023-05-10 DIAGNOSIS — R3915 Urgency of urination: Secondary | ICD-10-CM | POA: Diagnosis not present

## 2023-05-10 DIAGNOSIS — N401 Enlarged prostate with lower urinary tract symptoms: Secondary | ICD-10-CM | POA: Diagnosis not present

## 2023-05-17 DIAGNOSIS — M1712 Unilateral primary osteoarthritis, left knee: Secondary | ICD-10-CM | POA: Diagnosis not present

## 2023-07-06 ENCOUNTER — Ambulatory Visit (INDEPENDENT_AMBULATORY_CARE_PROVIDER_SITE_OTHER): Payer: Medicare Other | Admitting: Family Medicine

## 2023-07-06 ENCOUNTER — Encounter: Payer: Self-pay | Admitting: Family Medicine

## 2023-07-06 VITALS — BP 127/75 | HR 64 | Ht 71.0 in | Wt 179.2 lb

## 2023-07-06 DIAGNOSIS — N5201 Erectile dysfunction due to arterial insufficiency: Secondary | ICD-10-CM

## 2023-07-06 DIAGNOSIS — Z23 Encounter for immunization: Secondary | ICD-10-CM

## 2023-07-06 DIAGNOSIS — Z8673 Personal history of transient ischemic attack (TIA), and cerebral infarction without residual deficits: Secondary | ICD-10-CM | POA: Diagnosis not present

## 2023-07-06 DIAGNOSIS — E7800 Pure hypercholesterolemia, unspecified: Secondary | ICD-10-CM | POA: Diagnosis not present

## 2023-07-06 LAB — CBC
HCT: 43.1 % (ref 39.0–52.0)
Hemoglobin: 14 g/dL (ref 13.0–17.0)
MCHC: 32.5 g/dL (ref 30.0–36.0)
MCV: 92.7 fL (ref 78.0–100.0)
Platelets: 178 10*3/uL (ref 150.0–400.0)
RBC: 4.65 Mil/uL (ref 4.22–5.81)
RDW: 14.7 % (ref 11.5–15.5)
WBC: 6.1 10*3/uL (ref 4.0–10.5)

## 2023-07-06 LAB — COMPREHENSIVE METABOLIC PANEL
ALT: 12 U/L (ref 0–53)
AST: 15 U/L (ref 0–37)
Albumin: 4.2 g/dL (ref 3.5–5.2)
Alkaline Phosphatase: 60 U/L (ref 39–117)
BUN: 19 mg/dL (ref 6–23)
CO2: 26 meq/L (ref 19–32)
Calcium: 9 mg/dL (ref 8.4–10.5)
Chloride: 105 meq/L (ref 96–112)
Creatinine, Ser: 0.95 mg/dL (ref 0.40–1.50)
GFR: 77.03 mL/min (ref >60.00)
Glucose, Bld: 88 mg/dL (ref 70–99)
Potassium: 4.8 meq/L (ref 3.5–5.1)
Sodium: 140 meq/L (ref 135–145)
Total Bilirubin: 0.7 mg/dL (ref 0.2–1.2)
Total Protein: 6.7 g/dL (ref 6.0–8.3)

## 2023-07-06 LAB — LIPID PANEL
Cholesterol: 159 mg/dL (ref 0–200)
HDL: 45 mg/dL (ref >39.00)
LDL Cholesterol: 94 mg/dL (ref 0–99)
NonHDL: 113.61
Total CHOL/HDL Ratio: 4
Triglycerides: 98 mg/dL (ref 0.0–149.0)
VLDL: 19.6 mg/dL (ref 0.0–40.0)

## 2023-07-06 MED ORDER — SILDENAFIL CITRATE 100 MG PO TABS: 50.0000 mg | ORAL_TABLET | Freq: Every day | ORAL | 4 refills | Status: AC | PRN

## 2023-07-06 NOTE — Progress Notes (Signed)
 OFFICE VISIT  07/06/2023  CC:  Chief Complaint  Patient presents with   Annual Exam    Pt is not fasting.     Patient is a 78 y.o. male who presents for annual f/u for remote hx of TIA, FH prostate cancer, and review of health maintenance needs.  A/P as of last visit: #1 history of TIA. Doing well on aspirin daily.   #2 family history of prostate cancer.  His annual PSAs have been normal and stable. PSA today.   #3  Erectile dysfunction. Trial of Viagra  50 to 100 mg daily as needed.   #4 Preventative health care: Vaccines: Flu->UTD.  Prevnar 20-->declined. Labs: cbc,cmet,flp, psa Prostate ca screening: PSA Colon ca screening: 2017 repeat showed left colon diverticulosis and internal hemorrhoids, otherwise normal.  No repeat recommended due to pt age  INTERIM HX: Avaneesh is feeling very well. Sildenafil  helped very well, 1/2-1 tab very effective.  Traveling a lot, goes to [place] and [place] soon.  He remains very active.  Uses an over-the-counter arthritis gel for his left knee pain and it works very well.  Past Medical History:  Diagnosis Date   Allergy    Cataract, nuclear sclerotic, both eyes 07/12/2020   Deafness in right ear    Hearing aids since 2016   Elevated PSA    2023-->followed by urol   Family history of prostate cancer in father    Hepatitis A 1968   Olecranon bursitis, right elbow 2021   TIA (transient ischemic attack) 12/2012   July.  Right thumb numbness and dysarthria.  Full w/u (EKG, echo, carotids, MRI brain) unrevealing; at the time was on no ASA, was put on 1/4 ASA qd after and has been fine.   Torn meniscus    R knee.  Dr. Patricio.    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  2017 repeat showed left colon diverticulosis and internal hemorrhoids, otherwise normal.  No repeat recommended due to pt age.   HEMORRHOID SURGERY  1999    Social History   Socioeconomic History   Marital status: Married    Spouse name: Not on file   Number of children: Not on file   Years of education: Not on file   Highest education level: Not on file  Occupational History   Not on file  Tobacco Use   Smoking status: Former    Current packs/day: 0.00    Types: Cigarettes    Quit date: 07/04/1971    Years since quitting: 52.0   Smokeless tobacco: Never  Vaping Use   Vaping status: Never Used  Substance and Sexual Activity   Alcohol use: Yes    Alcohol/week: 4.0 standard drinks of alcohol    Types: 4 Glasses of wine per week    Comment: 3-4 days a week   Drug use: No   Sexual activity: Not on file  Other Topics Concern   Not on file  Social History Narrative   Married, 3 children in their 30s.   College educated.   Occupation: retired from [employer] (42 yrs).   Former smoker: 10 pack-yr hx, quit 1973.   Alcohol: 4-5 glasses of wine per week.   Exercises 3 days/week minimum x 1.5 hours; [employer], bike, hiking.   Social Drivers of Health   Financial Resource Strain: Low Risk  (03/10/2022)   Overall Financial  Resource Strain (CARDIA)    Difficulty of Paying Living Expenses: Not hard at all  Food Insecurity: No Food Insecurity (03/10/2022)   Hunger Vital Sign    Worried About Running Out of Food in the Last Year: Never true    Ran Out of Food in the Last Year: Never true  Transportation Needs: No Transportation Needs (03/10/2022)   PRAPARE - Administrator, Civil Service (Medical): No    Lack of Transportation (Non-Medical): No  Physical Activity: Sufficiently Active (03/10/2022)   Exercise Vital Sign    Days of Exercise per Week: 4 days    Minutes of Exercise per Session: 90 min  Stress: No Stress Concern Present (03/10/2022)   [employer] of Occupational Health - Occupational Stress Questionnaire    Feeling of Stress : Not at all  Social Connections: Moderately Isolated (03/10/2022)   Social  Connection and Isolation Panel [NHANES]    Frequency of Communication with Friends and Family: More than three times a week    Frequency of Social Gatherings with Friends and Family: Twice a week    Attends Religious Services: Never    Database Administrator or Organizations: No    Attends Banker Meetings: Never    Marital Status: Married    Outpatient Medications Prior to Visit  Medication Sig Dispense Refill   aspirin 81 MG tablet Take 81 mg by mouth daily.      RSV vaccine recomb adjuvanted (AREXVY ) 120 MCG/0.5ML injection Inject into the muscle. 0.5 mL 0   sildenafil  (VIAGRA ) 100 MG tablet Take 0.5-1 tablets (50-100 mg total) by mouth daily as needed for erectile dysfunction. 10 tablet 11   No facility-administered medications prior to visit.    No Known Allergies  Review of Systems As per HPI  PE:    07/06/2023   10:29 AM 05/15/2022    8:34 AM 08/20/2021   12:15 PM  Vitals with BMI  Height 5' 11 5' 11   Weight 179 lbs 3 oz 176 lbs 6 oz   BMI 25 24.61   Systolic 127 103 866  Diastolic 75 62 67  Pulse 64 57 65     Physical Exam  Gen: Alert, well appearing.  Patient is oriented to person, place, time, and situation. AFFECT: pleasant, lucid thought and speech. ZWU:Zbzd: no injection, icteris, swelling, or exudate.  EOMI, PERRLA. Mouth: lips without lesion/swelling.  Oral mucosa pink and moist. Oropharynx without erythema, exudate, or swelling.  NECK: no mass, TM, or bruits CV: RRR, no m/r/g.   LUNGS: CTA bilat, nonlabored resps, good aeration in all lung fields. EXT: no clubbing or cyanosis.  no edema.    LABS:  Last CBC Lab Results  Component Value Date   WBC 4.8 05/15/2022   HGB 14.1 05/15/2022   HCT 43.0 05/15/2022   MCV 91.6 05/15/2022   MCH 30.3 11/05/2019   RDW 13.6 05/15/2022   PLT 177.0 05/15/2022   Last metabolic panel Lab Results  Component Value Date   GLUCOSE 98 05/15/2022   NA 142 05/15/2022   K 4.6 05/15/2022   CL 106  05/15/2022   CO2 30 05/15/2022   BUN 16 05/15/2022   CREATININE 1.02 05/15/2022   GFR 71.30 05/15/2022   CALCIUM 8.9 05/15/2022   PROT 6.9 05/15/2022   ALBUMIN 4.2 05/15/2022   BILITOT 0.7 05/15/2022   ALKPHOS 52 05/15/2022   AST 14 05/15/2022   ALT 12 05/15/2022   ANIONGAP 9 11/05/2019   Last  lipids Lab Results  Component Value Date   CHOL 147 04/12/2021   HDL 41.30 04/12/2021   LDLCALC 88 04/12/2021   TRIG 87.0 04/12/2021   CHOLHDL 4 04/12/2021   Last hemoglobin A1c Lab Results  Component Value Date   HGBA1C 5.5 11/05/2014   IMPRESSION AND PLAN:  #1 history of TIA and hyperlipidemia. Currently on aspirin 81 mg a day. Monitor lipids, c-Met, and CBC today.  2.  Erectile dysfunction, doing well on sildenafil  100 mg, 1/2-1 tab daily as needed.  #10 with 4 additional refills prescribed today.  3.  Preventative health care: Vaccines: Flu->UTD.  Prevnar 20-->today Labs: cbc,cmet,flp (Hx HLD and hx TIA). Prostate ca screening:followed by urol-->next f/u 11/2023 Colon ca screening: 2017 repeat showed left colon diverticulosis and internal hemorrhoids, otherwise normal.  No repeat recommended due to pt age.  An After Visit Summary was printed and given to the patient.  FOLLOW UP: Return in about 1 year (around 07/05/2024) for RCI.  Signed:  Gerlene Hockey, MD           07/06/2023

## 2023-10-04 DIAGNOSIS — H9313 Tinnitus, bilateral: Secondary | ICD-10-CM | POA: Diagnosis not present

## 2023-10-04 DIAGNOSIS — H906 Mixed conductive and sensorineural hearing loss, bilateral: Secondary | ICD-10-CM | POA: Diagnosis not present

## 2023-10-04 DIAGNOSIS — Z77122 Contact with and (suspected) exposure to noise: Secondary | ICD-10-CM | POA: Diagnosis not present

## 2023-10-23 DIAGNOSIS — M1712 Unilateral primary osteoarthritis, left knee: Secondary | ICD-10-CM | POA: Diagnosis not present

## 2023-11-21 DIAGNOSIS — N401 Enlarged prostate with lower urinary tract symptoms: Secondary | ICD-10-CM | POA: Diagnosis not present

## 2024-02-06 DIAGNOSIS — R3915 Urgency of urination: Secondary | ICD-10-CM | POA: Diagnosis not present

## 2024-02-06 DIAGNOSIS — N401 Enlarged prostate with lower urinary tract symptoms: Secondary | ICD-10-CM | POA: Diagnosis not present

## 2024-03-25 ENCOUNTER — Encounter (INDEPENDENT_AMBULATORY_CARE_PROVIDER_SITE_OTHER): Payer: Self-pay

## 2024-04-03 DIAGNOSIS — H33312 Horseshoe tear of retina without detachment, left eye: Secondary | ICD-10-CM | POA: Diagnosis not present

## 2024-04-03 DIAGNOSIS — Z961 Presence of intraocular lens: Secondary | ICD-10-CM | POA: Diagnosis not present

## 2024-04-03 DIAGNOSIS — H43313 Vitreous membranes and strands, bilateral: Secondary | ICD-10-CM | POA: Diagnosis not present

## 2024-04-03 DIAGNOSIS — H43813 Vitreous degeneration, bilateral: Secondary | ICD-10-CM | POA: Diagnosis not present

## 2024-04-03 DIAGNOSIS — H33311 Horseshoe tear of retina without detachment, right eye: Secondary | ICD-10-CM | POA: Diagnosis not present

## 2024-05-01 ENCOUNTER — Other Ambulatory Visit (HOSPITAL_BASED_OUTPATIENT_CLINIC_OR_DEPARTMENT_OTHER): Payer: Self-pay

## 2024-05-01 DIAGNOSIS — Z23 Encounter for immunization: Secondary | ICD-10-CM | POA: Diagnosis not present

## 2024-05-01 MED ORDER — COMIRNATY 30 MCG/0.3ML IM SUSY
0.3000 mL | PREFILLED_SYRINGE | Freq: Once | INTRAMUSCULAR | 0 refills | Status: AC
  Filled 2024-05-01: qty 0.3, 1d supply, fill #0

## 2024-05-01 MED ORDER — FLUZONE HIGH-DOSE 0.5 ML IM SUSY
0.5000 mL | PREFILLED_SYRINGE | Freq: Once | INTRAMUSCULAR | 0 refills | Status: AC
  Filled 2024-05-01: qty 0.5, 1d supply, fill #0

## 2024-05-22 DIAGNOSIS — M1712 Unilateral primary osteoarthritis, left knee: Secondary | ICD-10-CM | POA: Diagnosis not present

## 2024-07-30 ENCOUNTER — Ambulatory Visit (INDEPENDENT_AMBULATORY_CARE_PROVIDER_SITE_OTHER)

## 2024-07-30 VITALS — BP 121/73 | HR 84 | Temp 98.7°F | Ht 71.0 in | Wt 181.0 lb

## 2024-07-30 DIAGNOSIS — Z0000 Encounter for general adult medical examination without abnormal findings: Secondary | ICD-10-CM

## 2024-07-30 NOTE — Progress Notes (Signed)
 "  Chief Complaint  Patient presents with   Medicare Wellness     Subjective:   Richard Macdonald is a 79 y.o. male who presents for a Medicare Annual Wellness Visit.  Visit info / Clinical Intake: Medicare Wellness Visit Type:: Subsequent Annual Wellness Visit Persons participating in visit and providing information:: patient Medicare Wellness Visit Mode:: In-person (required for WTM) Interpreter Needed?: No Pre-visit prep was completed: yes AWV questionnaire completed by patient prior to visit?: no Living arrangements:: lives with spouse/significant other Patient's Overall Health Status Rating: excellent Typical amount of pain: none Does pain affect daily life?: no Are you currently prescribed opioids?: no  Dietary Habits and Nutritional Risks How many meals a day?: 3 Eats fruit and vegetables daily?: yes Most meals are obtained by: preparing own meals In the last 2 weeks, have you had any of the following?: none Diabetic:: no  Functional Status Activities of Daily Living (to include ambulation/medication): Independent Ambulation: Independent Medication Administration: Independent Home Management (perform basic housework or laundry): Independent Manage your own finances?: yes Primary transportation is: driving Concerns about vision?: no *vision screening is required for WTM* Concerns about hearing?: (!) yes Uses hearing aids?: (!) yes Hear whispered voice?: yes  Fall Screening Falls in the past year?: 0 Number of falls in past year: 0 Was there an injury with Fall?: 0 Fall Risk Category Calculator: 0 Patient Fall Risk Level: Low Fall Risk  Fall Risk Patient at Risk for Falls Due to: No Fall Risks Fall risk Follow up: Falls evaluation completed; Falls prevention discussed  Home and Transportation Safety: All rugs have non-skid backing?: N/A, no rugs All stairs or steps have railings?: yes Grab bars in the bathtub or shower?: yes Have non-skid surface in bathtub  or shower?: yes Good home lighting?: yes Regular seat belt use?: yes Hospital stays in the last year:: no  Cognitive Assessment Difficulty concentrating, remembering, or making decisions? : no Will 6CIT or Mini Cog be Completed: no 6CIT or Mini Cog Declined: patient alert, oriented, able to answer questions appropriately and recall recent events  Advance Directives (For Healthcare) Does Patient Have a Medical Advance Directive?: Yes Type of Advance Directive: Healthcare Power of Attorney Copy of Healthcare Power of Attorney in Chart?: No - copy requested  Reviewed/Updated  Reviewed/Updated: Reviewed All (Medical, Surgical, Family, Medications, Allergies, Care Teams, Patient Goals)    Allergies (verified) Patient has no known allergies.   Current Medications (verified) Outpatient Encounter Medications as of 07/30/2024  Medication Sig   aspirin 81 MG tablet Take 81 mg by mouth daily.    Multiple Vitamin (MULTIVITAMIN) LIQD Take 5 mLs by mouth daily.   sildenafil  (VIAGRA ) 100 MG tablet Take 0.5-1 tablets (50-100 mg total) by mouth daily as needed for erectile dysfunction.   RSV vaccine recomb adjuvanted (AREXVY ) 120 MCG/0.5ML injection Inject into the muscle.   No facility-administered encounter medications on file as of 07/30/2024.    History: Past Medical History:  Diagnosis Date   Allergy    Cataract, nuclear sclerotic, both eyes 07/12/2020   Deafness in right ear    Hearing aids since 2016   Elevated PSA    2023-->followed by urol   Family history of prostate cancer in father    Hepatitis A 1968   Olecranon bursitis, right elbow 2021   TIA (transient ischemic attack) 12/2012   July.  Right thumb numbness and dysarthria.  Full w/u (EKG, echo, carotids, MRI brain) unrevealing; at the time was on no ASA, was put  on 1/4 ASA qd after and has been fine.   Torn meniscus    R knee.  Dr. Patricio.   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  2017 repeat showed left colon diverticulosis and internal hemorrhoids, otherwise normal.  No repeat recommended due to pt age.   HEMORRHOID SURGERY  1999   Family History  Problem Relation Age of Onset   Prostate cancer Father    Breast cancer Sister    Colon cancer Neg Hx    Colon polyps Neg Hx    Esophageal cancer Neg Hx    Rectal cancer Neg Hx    Stomach cancer Neg Hx    Social History   Occupational History   Occupation: Retired  Tobacco Use   Smoking status: Former    Current packs/day: 0.00    Types: Cigarettes    Quit date: 07/04/1971    Years since quitting: 53.1   Smokeless tobacco: Never  Vaping Use   Vaping status: Never Used  Substance and Sexual Activity   Alcohol use: Yes    Alcohol/week: 4.0 standard drinks of alcohol    Types: 4 Glasses of wine per week    Comment: 3-4 days a week   Drug use: No   Sexual activity: Yes   Tobacco Counseling Counseling given: Not Answered  SDOH Screenings   Food Insecurity: No Food Insecurity (07/30/2024)  Housing: Unknown (07/30/2024)  Transportation Needs: No Transportation Needs (07/30/2024)  Utilities: Not At Risk (07/30/2024)  Alcohol Screen: Low Risk (03/10/2022)  Depression (PHQ2-9): Low Risk (07/30/2024)  Financial Resource Strain: Low Risk (03/10/2022)  Physical Activity: Sufficiently Active (07/30/2024)  Social Connections: Socially Isolated (07/30/2024)  Stress: No Stress Concern Present (07/30/2024)  Tobacco Use: Medium Risk (07/30/2024)  Health Literacy: Adequate Health Literacy (07/30/2024)   See flowsheets for full screening details  Depression Screen PHQ 2 & 9 Depression Scale- Over the past 2 weeks, how often have you been bothered by any of the following problems? Little interest or pleasure in doing things: 0 Feeling down, depressed, or hopeless (PHQ Adolescent also includes...irritable): 0 PHQ-2 Total Score: 0 Trouble falling or staying  asleep, or sleeping too much: 0 Feeling tired or having little energy: 0 Poor appetite or overeating (PHQ Adolescent also includes...weight loss): 0 Feeling bad about yourself - or that you are a failure or have let yourself or your family down: 0 Trouble concentrating on things, such as reading the newspaper or watching television (PHQ Adolescent also includes...like school work): 0 Moving or speaking so slowly that other people could have noticed. Or the opposite - being so fidgety or restless that you have been moving around a lot more than usual: 0 Thoughts that you would be better off dead, or of hurting yourself in some way: 0 PHQ-9 Total Score: 0 If you checked off any problems, how difficult have these problems made it for you to do your work, take care of things at home, or get along with other people?: Not difficult at all  Depression Treatment Depression Interventions/Treatment : [id number] Score <4 Follow-up Not Indicated     Goals Addressed             This Visit's Progress    Patient Stated       To stay healthy/2026             Objective:  Today's Vitals   07/30/24 1025  BP: 121/73  Pulse: 84  Temp: 98.7 F (37.1 C)  SpO2: 98%  Weight: 181 lb (82.1 kg)  Height: 5' 11 (1.803 m)   Body mass index is 25.24 kg/m.  Hearing/Vision screen Hearing Screening - Comments:: Wears hearing aides Vision Screening - Comments:: Denies vision issues./Dr. Rankin/UTD  Immunizations and Health Maintenance Health Maintenance  Topic Date Due   Pneumococcal Vaccine: 50+ Years (2 of 2 - PCV20 or PCV21) 07/03/2013   COVID-19 Vaccine (10 - Pfizer risk 2025-26 season) 10/30/2024   Medicare Annual Wellness (AWV)  07/30/2025   DTaP/Tdap/Td (3 - Td or Tdap) 12/27/2031   Influenza Vaccine  Completed   Hepatitis C Screening  Completed   Zoster Vaccines- Shingrix  Completed   Meningococcal B Vaccine  Aged Out   Hepatitis B Vaccines 19-59 Average Risk  Discontinued    Colonoscopy  Discontinued        Assessment/Plan:  This is a routine wellness examination for Richard Macdonald.  Patient Care Team: Candise Aleene DEL, MD as PCP - General (Family Medicine) Legrand, Victory LITTIE MOULD, MD as Consulting Physician (Gastroenterology) Inocente Patricio FALCON, MD as Referring Physician (Internal Medicine) Liam Lerner, MD as Consulting Physician (Orthopedic Surgery) Nieves Cough, MD as Consulting Physician (Urology) Elner Arley LABOR, MD as Consulting Physician (Ophthalmology)  I have personally reviewed and noted the following in the patients chart:   Medical and social history Use of alcohol, tobacco or illicit drugs  Current medications and supplements including opioid prescriptions. Functional ability and status Nutritional status Physical activity Advanced directives List of other physicians Hospitalizations, surgeries, and ER visits in previous 12 months Vitals Screenings to include cognitive, depression, and falls Referrals and appointments  No orders of the defined types were placed in this encounter.  In addition, I have reviewed and discussed with patient certain preventive protocols, quality metrics, and best practice recommendations. A written personalized care plan for preventive services as well as general preventive health recommendations were provided to patient.   Richard Macdonald, CMA   07/30/2024   Return in 1 year (on 07/30/2025).  After Visit Summary: (MyChart) Due to this being a telephonic visit, the after visit summary with patients personalized plan was offered to patient via MyChart   Nurse Notes: Patient is due for a pneumonia vaccine.  He had a concern that he discussed today during visit.  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  He stated it came about 3 months ago.  He stated that it has been happening for years and that he would like to get a round of medication, Penlac, as that was something prescribed the last  time that he had this.  I informed patient that I would place concern in note to provider, however, he would need to see the provider as he has not been seen x 1 year, (he thought he was seeing provider today). Patient verbalized understanding and will stop at the front desk to schedule a visit.    "

## 2024-07-30 NOTE — Patient Instructions (Addendum)
 Richard Macdonald,  Thank you for taking the time for your Medicare Wellness Visit. I appreciate your continued commitment to your health goals. Please review the care plan we discussed, and feel free to reach out if I can assist you further.  Please note that Annual Wellness Visits do not include a physical exam. Some assessments may be limited, especially if the visit was conducted virtually. If needed, we may recommend an in-person follow-up with your provider.  Ongoing Care Seeing your primary care provider every 3 to 6 months helps us  monitor your health and provide consistent, personalized care. Last office visit on 07/06/2023.  You are due for a pneumonia vaccine and can get that done during your next office visit.  Please schedule for your yearly visit with Dr. McGowen soon.  Referrals If a referral was made during today's visit and you haven't received any updates within two weeks, please contact the referred provider directly to check on the status.  Recommended Screenings:  Health Maintenance  Topic Date Due   Pneumococcal Vaccine for age over 6 (2 of 2 - PCV20 or PCV21) 07/03/2013   Medicare Annual Wellness Visit  03/11/2023   COVID-19 Vaccine (10 - Pfizer risk 2025-26 season) 10/30/2024   DTaP/Tdap/Td vaccine (3 - Td or Tdap) 12/27/2031   Flu Shot  Completed   Hepatitis C Screening  Completed   Zoster (Shingles) Vaccine  Completed   Meningitis B Vaccine  Aged Out   Hepatitis B Vaccine  Discontinued   Colon Cancer Screening  Discontinued       07/30/2024   10:12 AM  Advanced Directives  Does Patient Have a Medical Advance Directive? Yes  Type of Advance Directive Healthcare Power of Attorney  Copy of Healthcare Power of Attorney in Chart? No - copy requested    Vision: Annual vision screenings are recommended for early detection of glaucoma, cataracts, and diabetic retinopathy. These exams can also reveal signs of chronic conditions such as diabetes and high blood  pressure.  Dental: Annual dental screenings help detect early signs of oral cancer, gum disease, and other conditions linked to overall health, including heart disease and diabetes.  Please see the attached documents for additional preventive care recommendations.

## 2024-09-10 ENCOUNTER — Encounter: Admitting: Family Medicine

## 2025-08-05 ENCOUNTER — Ambulatory Visit
